# Patient Record
Sex: Female | Born: 1990 | Race: White | Hispanic: No | Marital: Single | Smoking: Current every day smoker
Health system: Southern US, Community
[De-identification: ages and names within clinical notes are randomized; demographics above are authoritative.]

## PROBLEM LIST (undated history)

## (undated) DIAGNOSIS — A6000 Herpesviral infection of urogenital system, unspecified: Secondary | ICD-10-CM

## (undated) DIAGNOSIS — A749 Chlamydial infection, unspecified: Secondary | ICD-10-CM

## (undated) DIAGNOSIS — Z98891 History of uterine scar from previous surgery: Secondary | ICD-10-CM

## (undated) HISTORY — DX: Chlamydial infection, unspecified: A74.9

## (undated) HISTORY — DX: Herpesviral infection of urogenital system, unspecified: A60.00

---
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
## 2013-01-23 ENCOUNTER — Emergency Department: Payer: Self-pay | Admitting: Emergency Medicine

## 2013-01-23 LAB — BASIC METABOLIC PANEL
BUN: 7 mg/dL (ref 7–18)
Chloride: 103 mmol/L (ref 98–107)
Co2: 24 mmol/L (ref 21–32)
Creatinine: 0.62 mg/dL (ref 0.60–1.30)
EGFR (African American): >60
EGFR (Non-African Amer.): >60
Osmolality: 269 (ref 275–301)
Potassium: 3.8 mmol/L (ref 3.5–5.1)
Sodium: 136 mmol/L (ref 136–145)

## 2013-01-23 LAB — URINALYSIS, COMPLETE
Bilirubin,UR: NEGATIVE
Blood: NEGATIVE
Glucose,UR: NEGATIVE mg/dL (ref 0–75)
Nitrite: NEGATIVE
Ph: 5 (ref 4.5–8.0)
Protein: NEGATIVE
Squamous Epithelial: 5
WBC UR: 3 /HPF (ref 0–5)

## 2013-01-23 LAB — CBC
HCT: 38.8 % (ref 35.0–47.0)
HGB: 13.8 g/dL (ref 12.0–16.0)
MCV: 93 fL (ref 80–100)
Platelet: 257 10*3/uL (ref 150–440)
RBC: 4.18 10*6/uL (ref 3.80–5.20)
RDW: 11.9 % (ref 11.5–14.5)

## 2013-01-23 LAB — HCG, QUANTITATIVE, PREGNANCY: Beta Hcg, Quant.: 29944 m[IU]/mL — ABNORMAL HIGH

## 2013-04-10 ENCOUNTER — Emergency Department: Payer: Self-pay | Admitting: Emergency Medicine

## 2013-08-25 ENCOUNTER — Observation Stay: Payer: Self-pay

## 2013-08-27 LAB — BETA STREP CULTURE(ARMC)

## 2013-09-19 ENCOUNTER — Ambulatory Visit: Payer: Self-pay | Admitting: Obstetrics and Gynecology

## 2013-09-19 LAB — CBC WITH DIFFERENTIAL/PLATELET
BASOS ABS: 0 10*3/uL (ref 0.0–0.1)
Basophil %: 0.1 %
Eosinophil #: 0.1 10*3/uL (ref 0.0–0.7)
Eosinophil %: 1.1 %
HCT: 34.1 % — ABNORMAL LOW (ref 35.0–47.0)
HGB: 12 g/dL (ref 12.0–16.0)
LYMPHS ABS: 2.5 10*3/uL (ref 1.0–3.6)
LYMPHS PCT: 19.7 %
MCH: 34.6 pg — ABNORMAL HIGH (ref 26.0–34.0)
MCHC: 35.1 g/dL (ref 32.0–36.0)
MCV: 99 fL (ref 80–100)
Monocyte #: 0.7 x10 3/mm (ref 0.2–0.9)
Monocyte %: 5.7 %
NEUTROS PCT: 73.4 %
Neutrophil #: 9.3 10*3/uL — ABNORMAL HIGH (ref 1.4–6.5)
Platelet: 227 10*3/uL (ref 150–440)
RBC: 3.46 10*6/uL — ABNORMAL LOW (ref 3.80–5.20)
RDW: 13.3 % (ref 11.5–14.5)
WBC: 12.7 10*3/uL — ABNORMAL HIGH (ref 3.6–11.0)

## 2013-09-20 ENCOUNTER — Inpatient Hospital Stay: Payer: Self-pay | Admitting: Obstetrics and Gynecology

## 2013-09-21 LAB — GC/CHLAMYDIA PROBE AMP

## 2013-09-21 LAB — HEMATOCRIT: HCT: 28.9 % — ABNORMAL LOW (ref 35.0–47.0)

## 2013-09-28 ENCOUNTER — Emergency Department: Payer: Self-pay | Admitting: Emergency Medicine

## 2013-09-28 LAB — URINALYSIS, COMPLETE
BACTERIA: NONE SEEN
Bilirubin,UR: NEGATIVE
Glucose,UR: NEGATIVE mg/dL (ref 0–75)
Ketone: NEGATIVE
Nitrite: NEGATIVE
Ph: 6 (ref 4.5–8.0)
Protein: NEGATIVE
Specific Gravity: 1.01 (ref 1.003–1.030)
Squamous Epithelial: 3
WBC UR: 23 /HPF (ref 0–5)

## 2013-09-28 LAB — CBC
HCT: 32.4 % — ABNORMAL LOW (ref 35.0–47.0)
HGB: 10.4 g/dL — AB (ref 12.0–16.0)
MCH: 31.6 pg (ref 26.0–34.0)
MCHC: 32.2 g/dL (ref 32.0–36.0)
MCV: 98 fL (ref 80–100)
Platelet: 379 10*3/uL (ref 150–440)
RBC: 3.29 10*6/uL — ABNORMAL LOW (ref 3.80–5.20)
RDW: 12.8 % (ref 11.5–14.5)
WBC: 11 10*3/uL (ref 3.6–11.0)

## 2013-09-28 LAB — HCG, QUANTITATIVE, PREGNANCY: Beta Hcg, Quant.: 42 m[IU]/mL — ABNORMAL HIGH

## 2013-12-08 ENCOUNTER — Ambulatory Visit: Payer: Self-pay | Admitting: Emergency Medicine

## 2014-03-03 ENCOUNTER — Emergency Department: Payer: Self-pay | Admitting: Emergency Medicine

## 2014-03-03 LAB — BASIC METABOLIC PANEL
Anion Gap: 7 (ref 7–16)
BUN: 11 mg/dL (ref 7–18)
CHLORIDE: 108 mmol/L — AB (ref 98–107)
Calcium, Total: 8.8 mg/dL (ref 8.5–10.1)
Co2: 28 mmol/L (ref 21–32)
Creatinine: 0.7 mg/dL (ref 0.60–1.30)
EGFR (African American): >60
EGFR (Non-African Amer.): >60
Glucose: 105 mg/dL — ABNORMAL HIGH (ref 65–99)
Osmolality: 285 (ref 275–301)
POTASSIUM: 4.1 mmol/L (ref 3.5–5.1)
Sodium: 143 mmol/L (ref 136–145)

## 2014-03-03 LAB — URINALYSIS, COMPLETE
Bilirubin,UR: NEGATIVE
Glucose,UR: NEGATIVE mg/dL (ref 0–75)
Ketone: NEGATIVE
Leukocyte Esterase: NEGATIVE
Nitrite: NEGATIVE
PROTEIN: NEGATIVE
Ph: 7 (ref 4.5–8.0)
Specific Gravity: 1.024 (ref 1.003–1.030)
WBC UR: 2 /HPF (ref 0–5)

## 2014-03-03 LAB — CBC WITH DIFFERENTIAL/PLATELET
BASOS ABS: 0 10*3/uL (ref 0.0–0.1)
Basophil %: 0.5 %
EOS PCT: 4 %
Eosinophil #: 0.3 10*3/uL (ref 0.0–0.7)
HCT: 40.5 % (ref 35.0–47.0)
HGB: 13.6 g/dL (ref 12.0–16.0)
LYMPHS ABS: 2.7 10*3/uL (ref 1.0–3.6)
LYMPHS PCT: 37.6 %
MCH: 31.8 pg (ref 26.0–34.0)
MCHC: 33.6 g/dL (ref 32.0–36.0)
MCV: 95 fL (ref 80–100)
Monocyte #: 0.4 x10 3/mm (ref 0.2–0.9)
Monocyte %: 6.1 %
Neutrophil #: 3.8 10*3/uL (ref 1.4–6.5)
Neutrophil %: 51.8 %
Platelet: 300 10*3/uL (ref 150–440)
RBC: 4.28 10*6/uL (ref 3.80–5.20)
RDW: 13.5 % (ref 11.5–14.5)
WBC: 7.3 10*3/uL (ref 3.6–11.0)

## 2014-03-03 LAB — TROPONIN I: Troponin-I: <0.02 ng/mL

## 2014-08-09 ENCOUNTER — Emergency Department: Payer: Self-pay | Admitting: Emergency Medicine

## 2014-11-04 NOTE — Op Note (Signed)
PATIENT NAME:  Jane Bowman, [name] MR#:  [id number] DATE OF BIRTH:  Feb 13, 1991  DATE OF PROCEDURE:  09/20/2013  PREOPERATIVE DIAGNOSIS: Transverse lie fetus at term.   POSTOPERATIVE DIAGNOSIS: Transverse lie fetus at term.   PROCEDURE: Primary lower uterine segment cesarean section, placement of ON-Q pain pump.   SURGEON: Elliot Gurneyarrie C. Yaneth Fairbairn. M.D.   ASSISTANT: Lebron ConnersAndy Staebler, M.D.   ESTIMATED BLOOD LOSS: 1000 mL.   FINDINGS: Term live-born female infant weighing 8 pounds 9 ounces, Apgars 9 and 9.   DESCRIPTION OF PROCEDURE: The patient was taken to the operating room and placed in supine position. After adequate spinal anesthesia was instilled, the patient was prepped and draped in the usual sterile fashion. A Pfannenstiel skin incision was drawn on the patient's abdomen, and the incision was carried sharply down to the fascia. The fascia was nicked in the midline and was extended in a superolateral manner with a curved Mayo scissors. Kochers were used to separate the fascia from the muscles both superiorly and inferiorly. The muscle belly underlying was identified, split and the peritoneum was grasped and sharply entered. The incision was extended. Bladder blade was placed. Bladder flap was created. Bladder blade was replaced. Uterine incision was made slightly higher due to the transverse back-down lie. Infant's foot delivered. Second foot delivered. Fetus was pulled out of the uterus in a breech position. The right nuchal arm and then the left nuchal arm were delivered and the fetus' head was delivered. The infant's cord was clamped, cut. Infant was handed to the awaiting pediatrician. Cord blood was not obtained because the blood type was A positive. Pitocin was started. The uterus was delivered, and placenta was attempted to be delivered. The placenta was adherent to the uterus and the uterus inverted completely. Terbutaline was given. The placenta was removed, and the uterus was turned right side  out. It was found to be heart shaped with a large septum on the inside which the patient had neglected to tell us. The uterine incision was closed with a running locked chromic suture and then a running imbricating suture. Bladder was tacked back up to the uterine incision with a 3-0 Monocryl. The belly was cleared of clots and irrigated. The uterus was placed back into the abdomen. The gutters were cleared from clots. The muscle bellies were approximated with a running Vicryl suture. ON-Q trocars were placed. Catheters were threaded and wrapped underneath the fascia. The fascia was closed from the angles to the midline with a running Vicryl suture. A TLH was used to approximate the subcutaneous fat. A 4-0 Monocryl was used to approximate the skin edges. Benzoin and Steri-Strips were placed. Tegaderm and 4 x 4's with Dermabond were placed on the trocar port sites. The uterus was massaged. Clots removed. Clear urine was noted in the Foley bag, and the patient was taken to recovery after having tolerated the procedure well.    ____________________________ Elliot Gurneyarrie C. Isador Castille, MD cck:gb D: 09/21/2013 02:45:45 ET T: 09/21/2013 03:04:18 ET JOB#: [id number]  cc: Elliot Gurneyarrie C. Jacarie Pate, MD, <Dictator> Elliot GurneyARRIE C Kamika Goodloe MD ELECTRONICALLY SIGNED 09/23/2013 21:59

## 2014-11-21 NOTE — H&P (Signed)
L&D Evaluation:  History Expanded:  HPI 24 yo G3 P0111, EDD 09/15/13 per LMP and 7 wk US, presents with c/o decreased FM since last night. No ctx, VB or LOF. 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   Blood Type (Maternal) AB positive   Group B Strep Results Maternal (Result >5wks must be treated as unknown) unknown/result > 5 weeks ago  done here today   Maternal HIV Negative   Maternal Syphilis Ab Nonreactive   Maternal Varicella Immune   Rubella Results (Maternal) nonimmune   Exam:  Vital Signs stable   General no apparent distress   Mental Status clear   Chest clear   Abdomen gravid, non-tender   Pelvic no external lesions, 1.5/50/high   Mebranes Intact   FHT normal rate with no decels, category 1tracing, baseline 140, mod variability, + accles, no decels, palpable fetal movement   Ucx absent   Impression:  Impression decreased fetal movement, category 1 fetal tracing   Plan:  Plan discharge   Comments Pt's medicaid had been deactivated, per office it has been reactivated. Pt to f/u at office.  GBS drawn today.  Labor precautions   Follow Up Appointment already scheduled. 08/26/13   Electronic Signatures: Marta AntuBrothers, Dorann Davidson K (CNM)  (Signed 12-Feb-15 13:01)  Authored: L&D Evaluation   Last Updated: 12-Feb-15 13:01 by Vella KohlerBrothers, Monque Haggar K (CNM)

## 2016-01-21 ENCOUNTER — Emergency Department
Admission: EM | Admit: 2016-01-21 | Discharge: 2016-01-21 | Disposition: A | Discharge status: Home / Self Care | Payer: Medicaid Other | Attending: Emergency Medicine | Admitting: Emergency Medicine

## 2016-01-21 ENCOUNTER — Encounter: Payer: Self-pay | Admitting: Emergency Medicine

## 2016-01-21 DIAGNOSIS — F17200 Nicotine dependence, unspecified, uncomplicated: Secondary | ICD-10-CM | POA: Insufficient documentation

## 2016-01-21 DIAGNOSIS — Y999 Unspecified external cause status: Secondary | ICD-10-CM | POA: Insufficient documentation

## 2016-01-21 DIAGNOSIS — Y939 Activity, unspecified: Secondary | ICD-10-CM | POA: Diagnosis not present

## 2016-01-21 DIAGNOSIS — Y929 Unspecified place or not applicable: Secondary | ICD-10-CM | POA: Insufficient documentation

## 2016-01-21 DIAGNOSIS — W109XXA Fall (on) (from) unspecified stairs and steps, initial encounter: Secondary | ICD-10-CM | POA: Diagnosis not present

## 2016-01-21 DIAGNOSIS — S0101XA Laceration without foreign body of scalp, initial encounter: Secondary | ICD-10-CM | POA: Insufficient documentation

## 2016-01-21 HISTORY — DX: History of uterine scar from previous surgery: Z98.891

## 2016-01-21 MED ORDER — BACITRACIN ZINC 500 UNIT/GM EX OINT
1.0000 "application " | TOPICAL_OINTMENT | Freq: Two times a day (BID) | CUTANEOUS | Status: DC
  Filled 2016-01-21: qty 0.9

## 2016-01-21 NOTE — ED Notes (Signed)
Pt presents to ED c/o a fall over steps and hit head on a rock wall; presents with head laceration. Bleeding controlled

## 2016-01-21 NOTE — Discharge Instructions (Signed)
Nonsutured Laceration Care °A laceration is a cut that goes through all layers of the skin and extends into the tissue that is right under the skin. This type of cut is usually stitched up (sutured) or closed with tape (adhesive strips) or skin glue shortly after the injury happens. °However, if the wound is dirty or if several hours pass before medical treatment is provided, it is likely that germs (bacteria) will enter the wound. Closing a laceration after bacteria have entered it increases the risk of infection. In these cases, your health care provider may leave the laceration open (nonsutured) and cover it with a bandage. This type of treatment helps prevent infection and allows the wound to heal from the deepest layer of tissue damage up to the surface. °An open fracture is a type of injury that may involve nonsutured lacerations. An open fracture is a break in a bone that happens along with one or more lacerations through the skin that is near the fracture site. °HOW TO CARE FOR YOUR NONSUTURED LACERATION °· Take or apply over-the-counter and prescription medicines only as told by your health care provider. °· If you were prescribed an antibiotic medicine, take or apply it as told by your health care provider. Do not stop using the antibiotic even if your condition improves. °· Clean the wound one time each day or as told by your health care provider. °¨ Wash the wound with mild soap and water. °¨ Rinse the wound with water to remove all soap. °¨ Pat your wound dry with a clean towel. Do not rub the wound. °· Do not inject anything into the wound unless your health care provider told you to. °· Change any bandages (dressings) as told by your health care provider. This includes changing the dressing if it gets wet, dirty, or starts to smell bad. °· Keep the dressing dry until your health care provider says it can be removed. Do not take baths, swim, or do anything that puts your wound underwater until your  health care provider approves. °· Raise (elevate) the injured area above the level of your heart while you are sitting or lying down, if possible. °· Do not scratch or pick at the wound. °· Check your wound every day for signs of infection. Watch for: °¨ Redness, swelling, or pain. °¨ Fluid, blood, or pus. °· Keep all follow-up visits as told by your health care provider. This is important. °SEEK MEDICAL CARE IF: °· You received a tetanus and shot and you have swelling, severe pain, redness, or bleeding at the injection site.   °· You have a fever. °· Your pain is not controlled with medicine. °· You have increased redness, swelling, or pain at the site of your wound. °· You have fluid, blood, or pus coming from your wound. °· You notice a bad smell coming from your wound or your dressing. °· You notice something coming out of the wound, such as wood or glass. °· You notice a change in the color of your skin near your wound. °· You develop a new rash. °· You need to change the dressing frequently due to fluid, blood, or pus draining from the wound. °· You develop numbness around your wound. °SEEK IMMEDIATE MEDICAL CARE IF: °· Your pain suddenly increases and is severe. °· You develop severe swelling around the wound. °· The wound is on your hand or foot and you cannot properly move a finger or toe. °· The wound is on your hand or   foot and you notice that your fingers or toes look pale or bluish. °· You have a red streak going away from your wound. °  °This information is not intended to replace advice given to you by your health care provider. Make sure you discuss any questions you have with your health care provider. °  °Document Released: 05/28/2006 Document Revised: 11/14/2014 Document Reviewed: 06/26/2014 °Elsevier Interactive Patient Education ©2016 [employer] ° °

## 2016-01-21 NOTE — ED Provider Notes (Signed)
[facility] Emergency Department Provider Note  ____________________________________________  Time seen: Approximately 2:40 PM  I have reviewed the triage vital signs and the nursing notes.   HISTORY  Chief Complaint Head Laceration   HPI Jane Bowman is a 25 y.o. female who presents to the emergency department for evaluation of a scalp laceration that she sustained around 10 PM last night. She trepped over some steps and hit her head on a rock wall. She and some friends poured alcohol on the wound to clean it out. She has since showered. Bleeding continues to be well controlled.   Past Medical History  Diagnosis Date  . H/O: C-section     There are no active problems to display for this patient.   History reviewed. No pertinent past surgical history.  No current outpatient prescriptions on file.  Allergies Amoxicillin  Family History  Problem Relation Age of Onset  . Cancer Mother     Social History Social History  Substance Use Topics  . Smoking status: Current Every Day Smoker  . Smokeless tobacco: None  . Alcohol Use: Yes    Review of Systems  Constitutional: Negative for fever/chills Respiratory: Negative for shortness of breath. Musculoskeletal: Negative for pain. Skin: Positive for laceration. Neurological: Negative for headaches, focal weakness or numbness. ____________________________________________   PHYSICAL EXAM:  VITAL SIGNS: ED Triage Vitals  Enc Vitals Group     BP 01/21/16 1418 127/64 mmHg     Pulse Rate 01/21/16 1418 108     Resp 01/21/16 1418 18     Temp 01/21/16 1418 98.1 F (36.7 C)     Temp Source 01/21/16 1418 Oral     SpO2 01/21/16 1418 95 %     Weight 01/21/16 1418 160 lb (72.576 kg)     Height 01/21/16 1418  (1.575 m)     Head Cir --      Peak Flow --      Pain Score --      Pain Loc --      Pain Edu? --      Excl. in GC? --      Constitutional: Alert and oriented. Well appearing  and in no acute distress. Eyes: Conjunctivae are normal. EOMI. Nose: No congestion/rhinnorhea. Mouth/Throat: Mucous membranes are moist.   Neck: No stridor. Lymphatic: No cervical lymphadenopathy. Cardiovascular: Good peripheral circulation. Respiratory: Normal respiratory effort.  No retractions. Musculoskeletal: FROM throughout. Neurologic:  Normal speech and language. No gross focal neurologic deficits are appreciated. Skin:  2cm partial thickness laceration to the left posterior scalp. No active bleeding.  ____________________________________________   LABS (all labs ordered are listed, but only abnormal results are displayed)  Labs Reviewed - No data to display ____________________________________________  EKG   ____________________________________________  RADIOLOGY  Not indicated. ____________________________________________   PROCEDURES  Procedure(s) performed: None ____________________________________________   INITIAL IMPRESSION / ASSESSMENT AND PLAN / ED COURSE  Pertinent labs & imaging results that were available during my care of the patient were reviewed by me and considered in my medical decision making (see chart for details).  She will be advised to use bacitracin 2 times per day until healed.  She was advised to follow up with her PCP if needed.  She was also advised to return to the emergency department for symptoms that change or worsen if unable to schedule an appointment.  ____________________________________________   FINAL CLINICAL IMPRESSION(S) / ED DIAGNOSES  Final diagnoses:  Scalp laceration, initial encounter    New  Prescriptions   No medications on file     Chinita PesterCari B Kaire Stary, FNP 01/21/16 1446  Richardean Canalavid H Yao, MD 01/21/16 1537

## 2016-07-04 ENCOUNTER — Emergency Department
Admission: EM | Admit: 2016-07-04 | Discharge: 2016-07-04 | Disposition: A | Discharge status: Home / Self Care | Payer: Medicaid Other | Attending: Emergency Medicine | Admitting: Emergency Medicine

## 2016-07-04 DIAGNOSIS — F17200 Nicotine dependence, unspecified, uncomplicated: Secondary | ICD-10-CM | POA: Diagnosis not present

## 2016-07-04 DIAGNOSIS — J069 Acute upper respiratory infection, unspecified: Secondary | ICD-10-CM | POA: Insufficient documentation

## 2016-07-04 DIAGNOSIS — J029 Acute pharyngitis, unspecified: Secondary | ICD-10-CM | POA: Diagnosis present

## 2016-07-04 NOTE — ED Notes (Signed)
Pt presents with sore throat and congestion x 1.5 days. Denies fever, n/v/d, chills. Pt reports cough. NAD noted.

## 2016-07-04 NOTE — ED Notes (Signed)
Reviewed d/c instructions, follow-up care and OTC antipyretics with patient. Pt verbalized understanding

## 2016-07-04 NOTE — ED Triage Notes (Signed)
Sore throat x 2 days. Pt alert and oriented X4, active, cooperative, pt in NAD. RR even and unlabored, color WNL.

## 2016-07-04 NOTE — ED Provider Notes (Signed)
[facility] Emergency Department Provider Note  ____________________________________________  Time seen: Approximately 4:10 PM  I have reviewed the triage vital signs and the nursing notes.   HISTORY  Chief Complaint Sore Throat    HPI Jane Bowman is a 25 y.o. female as to the emergency department with 1 day of a sore throat and nonproductive cough. Patient is eating and drinking normally.  Patient has not taken anything for symptoms. Patient denies fever, sinus pressure, sneezing, shortness of breath, chest pain, nausea, vomiting, abdominal pain. Patient has been with her children whom have similar symptoms.   Past Medical History:  Diagnosis Date  . H/O: C-section     There are no active problems to display for this patient.   History reviewed. No pertinent surgical history.  Prior to Admission medications   Not on File    Allergies Amoxicillin  Family History  Problem Relation Age of Onset  . Cancer Mother     Social History Social History  Substance Use Topics  . Smoking status: Current Every Day Smoker  . Smokeless tobacco: Not on file  . Alcohol use Yes     Review of Systems  Constitutional: No fever/chills Eyes: No discharge. ENT: Negative for congestion and rhinorrhea. Cardiovascular: No chest pain. Respiratory: Positive for cough. No SOB. Gastrointestinal: No abdominal pain.  No nausea, no vomiting.  No diarrhea.  No constipation. Musculoskeletal: Negative for musculoskeletal pain. Skin: Negative for rash, abrasions, lacerations, ecchymosis. Neurological: Negative for headaches.   ____________________________________________   PHYSICAL EXAM:  VITAL SIGNS: ED Triage Vitals [07/04/16 1539]  Enc Vitals Group     BP 118/65     Pulse Rate 91     Resp 16     Temp 98.4 F (36.9 C)     Temp Source Oral     SpO2 100 %     Weight 160 lb (72.6 kg)     Height 5\' 2"  (1.575 m)     Head Circumference      Peak Flow       Pain Score 4     Pain Loc      Pain Edu?      Excl. in GC?      Constitutional: Alert and oriented. Well appearing and in no acute distress. Eyes: Conjunctivae are normal. PERRL. EOMI. No discharge. Head: Atraumatic. ENT: No frontal and maxillary sinus tenderness.      Ears: Tympanic membranes pearly gray with good landmarks. No discharge.      Nose: No congestion/rhinnorhea.      Mouth/Throat: Mucous membranes are moist. Oropharynx non-erythematous. Tonsils not enlarged. No exudates. Uvula midline. Neck: No stridor.   Hematological/Lymphatic/Immunilogical: No cervical lymphadenopathy. Cardiovascular: Normal rate, regular rhythm. Normal S1 and S2.  Good peripheral circulation. Respiratory: Normal respiratory effort without tachypnea or retractions. Lungs CTAB. Good air entry to the bases with no decreased or absent breath sounds. Gastrointestinal: Bowel sounds 4 quadrants. Soft and nontender to palpation. No guarding or rigidity. No palpable masses. No distention. Musculoskeletal: Full range of motion to all extremities. No gross deformities appreciated. Neurologic:  Normal speech and language. No gross focal neurologic deficits are appreciated.  Skin:  Skin is warm, dry and intact. No rash noted. Psychiatric: Mood and affect are normal. Speech and behavior are normal. Patient exhibits appropriate insight and judgement.   ____________________________________________   LABS (all labs ordered are listed, but only abnormal results are displayed)  Labs Reviewed - No data to display ____________________________________________  EKG  ____________________________________________  RADIOLOGY  No results found.  ____________________________________________    PROCEDURES  Procedure(s) performed:    Procedures    Medications - No data to display   ____________________________________________   INITIAL IMPRESSION / ASSESSMENT AND PLAN / ED COURSE  Pertinent  labs & imaging results that were available during my care of the patient were reviewed by me and considered in my medical decision making (see chart for details).  Review of the [place] CSRS was performed in accordance of the [facility] prior to dispensing any controlled drugs.  Clinical Course     Patient's diagnosis is consistent with viral upper respiratory infection. Vital signs and exam are reassuring. Symptoms have been present for 1.5 days. Patient is afebrile in ED and has not taken anything for symptoms. Patient declines wanting any medication for symptoms. Patient is given ED precautions to return to the ED for any worsening or new symptoms.  ____________________________________________  FINAL CLINICAL IMPRESSION(S) / ED DIAGNOSES  Final diagnoses:  Viral upper respiratory tract infection      NEW MEDICATIONS STARTED DURING THIS VISIT:  New Prescriptions   No medications on file        This chart was dictated using voice recognition software/Dragon. Despite best efforts to proofread, errors can occur which can change the meaning. Any change was purely unintentional.    Jane DerryAshley Norleen Xie, PA-C 07/04/16 1711    Jane MoccasinBrian S Quigley, MD 07/04/16 [phone]

## 2016-11-27 ENCOUNTER — Emergency Department
Admission: EM | Admit: 2016-11-27 | Discharge: 2016-11-27 | Disposition: A | Discharge status: Home / Self Care | Payer: Medicaid Other | Attending: Emergency Medicine | Admitting: Emergency Medicine

## 2016-11-27 ENCOUNTER — Emergency Department: Payer: Medicaid Other

## 2016-11-27 ENCOUNTER — Encounter: Payer: Self-pay | Admitting: Emergency Medicine

## 2016-11-27 DIAGNOSIS — R0789 Other chest pain: Secondary | ICD-10-CM | POA: Diagnosis not present

## 2016-11-27 DIAGNOSIS — F17200 Nicotine dependence, unspecified, uncomplicated: Secondary | ICD-10-CM | POA: Diagnosis not present

## 2016-11-27 DIAGNOSIS — R0602 Shortness of breath: Secondary | ICD-10-CM | POA: Insufficient documentation

## 2016-11-27 DIAGNOSIS — R079 Chest pain, unspecified: Secondary | ICD-10-CM | POA: Diagnosis present

## 2016-11-27 LAB — CBC
HCT: 37 % (ref 35.0–47.0)
Hemoglobin: 13.1 g/dL (ref 12.0–16.0)
MCH: 33 pg (ref 26.0–34.0)
MCHC: 35.4 g/dL (ref 32.0–36.0)
MCV: 93 fL (ref 80.0–100.0)
PLATELETS: 266 10*3/uL (ref 150–440)
RBC: 3.98 MIL/uL (ref 3.80–5.20)
RDW: 11.8 % (ref 11.5–14.5)
WBC: 4.8 10*3/uL (ref 3.6–11.0)

## 2016-11-27 LAB — POCT PREGNANCY, URINE: PREG TEST UR: NEGATIVE

## 2016-11-27 LAB — BASIC METABOLIC PANEL
ANION GAP: 5 (ref 5–15)
BUN: 12 mg/dL (ref 6–20)
CALCIUM: 8.9 mg/dL (ref 8.9–10.3)
CO2: 26 mmol/L (ref 22–32)
CREATININE: 0.68 mg/dL (ref 0.44–1.00)
Chloride: 106 mmol/L (ref 101–111)
GFR calc non Af Amer: >60 mL/min (ref >60)
Glucose, Bld: 85 mg/dL (ref 65–99)
Potassium: 3.8 mmol/L (ref 3.5–5.1)
Sodium: 137 mmol/L (ref 135–145)

## 2016-11-27 LAB — TROPONIN I

## 2016-11-27 LAB — FIBRIN DERIVATIVES D-DIMER (ARMC ONLY): FIBRIN DERIVATIVES D-DIMER (ARMC): 209.84 (ref 0.00–499.00)

## 2016-11-27 NOTE — ED Triage Notes (Signed)
Pt reports left side non radiating chest pain with SOB on exertion for two days.

## 2016-11-27 NOTE — ED Provider Notes (Signed)
[facility] Emergency Department Provider Note  ____________________________________________  Time seen: Approximately 10:06 AM  I have reviewed the triage vital signs and the nursing notes.   HISTORY  Chief Complaint Chest Pain    HPI Jane Bowman is a 26 y.o. female who complains of chest pain since yesterday. Gradual onset, constant, nonradiating. Feels sharp. Worse with breathing as well as lifting her daughter or twisting her thorax to reach behind her for her seatbelt. Denies diaphoresis or vomiting. Mild shortness of breath is present. No alleviating factors.  She reports having a similar pain in the right side of her chest in Jane 2017. This lasted for approximately 6 months before resolving. She reports seeing her doctors but never arriving at a diagnosis.     Past Medical History:  Diagnosis Date  . H/O: C-section      There are no active problems to display for this patient.    No past surgical history on file. C-section  Prior to Admission medications   Medication Sig Start Date End Date Taking? Authorizing Provider  cetirizine (ZYRTEC) 10 MG tablet Take 10 mg by mouth daily. 10/29/16  Yes [provider]  Oral birth control pills   Allergies Amoxicillin   Family History  Problem Relation Age of Onset  . Cancer Mother     Social History Social History  Substance Use Topics  . Smoking status: Current Every Day Smoker  . Smokeless tobacco: Not on file  . Alcohol use Yes    Review of Systems  Constitutional:   No fever or chills.  ENT:   No sore throat. No rhinorrhea. Cardiovascular:   Positive chest pain as above.Marland Kitchen. Respiratory:   Mild shortness of breath without cough. Gastrointestinal:   Negative for abdominal pain, vomiting and diarrhea.  Musculoskeletal:   Negative for focal pain or swelling All other systems reviewed and are negative except as documented above in ROS and  HPI.  ____________________________________________   PHYSICAL EXAM:  VITAL SIGNS: ED Triage Vitals  Enc Vitals Group     BP 11/27/16 0935 (!) 115/52     Pulse Rate 11/27/16 0935 70     Resp 11/27/16 0935 18     Temp 11/27/16 0935 98.2 F (36.8 C)     Temp Source 11/27/16 0935 Oral     SpO2 11/27/16 0935 100 %     Weight 11/27/16 0936 160 lb (72.6 kg)     Height 11/27/16 0936 5\' 2"  (1.575 m)     Head Circumference --      Peak Flow --      Pain Score 11/27/16 0936 9     Pain Loc --      Pain Edu? --      Excl. in GC? --     Vital signs reviewed, nursing assessments reviewed.   Constitutional:   Alert and oriented. Well appearing and in no distress. Eyes:   No scleral icterus. No conjunctival pallor. PERRL. EOMI.  No nystagmus. ENT   Head:   Normocephalic and atraumatic.   Nose:   No congestion/rhinnorhea.    Mouth/Throat:   MMM, no pharyngeal erythema. No peritonsillar mass.    Neck:   No meningismus. Full ROM. No crepitus Hematological/Lymphatic/Immunilogical:   No cervical lymphadenopathy. Cardiovascular:   RRR. Symmetric bilateral radial and DP pulses.  No murmurs.  Respiratory:   Normal respiratory effort without tachypnea/retractions. Breath sounds are clear and equal bilaterally. No wheezes/rales/rhonchi. Gastrointestinal:   Soft and nontender. Non distended.  There is no CVA tenderness.  No rebound, rigidity, or guarding. Genitourinary:   deferred Musculoskeletal:   Normal range of motion in all extremities. No joint effusions.  No lower extremity tenderness.  No edema. Neurologic:   Normal speech and language.   Motor grossly intact. No gross focal neurologic deficits are appreciated.    ____________________________________________    LABS (pertinent positives/negatives) (all labs ordered are listed, but only abnormal results are displayed) Labs Reviewed  BASIC METABOLIC PANEL  CBC  TROPONIN I  FIBRIN DERIVATIVES D-DIMER ([facility] ONLY)  POC  URINE PREG, ED  POCT PREGNANCY, URINE   ____________________________________________   EKG  Interpreted by me  Date: 11/27/2016  Rate: 65  Rhythm: normal sinus rhythm  QRS Axis: normal  Intervals: normal  ST/T Wave abnormalities: normal  Conduction Disutrbances: none  Narrative Interpretation: unremarkable      ____________________________________________    RADIOLOGY  Dg Chest 2 View  Result Date: 11/27/2016 CLINICAL DATA:  Left-sided chest pain and shortness of Breath, history of tobacco use, initial encounter EXAM: CHEST  2 VIEW COMPARISON:  None. FINDINGS: The heart size and mediastinal contours are within normal limits. Both lungs are clear. The visualized skeletal structures are unremarkable. IMPRESSION: No active cardiopulmonary disease. Electronically Signed   By: Alcide Clever M.D.   On: 11/27/2016 10:23    ____________________________________________   PROCEDURES Procedures  ____________________________________________   INITIAL IMPRESSION / ASSESSMENT AND PLAN / ED COURSE  Pertinent labs & imaging results that were available during my care of the patient were reviewed by me and considered in my medical decision making (see chart for details). Patient complains of chest pain which is likely musculoskeletal chest wall pain with exam findings and nature of aggravating factors. However with her birth control use, smoking, and other unusual factors will get a d-dimer to screen for venous thromboembolism. Have low suspicion for PE given her normal vital signs. No evidence of DVT on exam.   ----------------------------------------- 12:05 PM on 11/27/2016 -----------------------------------------  D-dimer within normal limits. Workup unremarkable, vitals normal. We'll discharge home for follow-up with primary care. Low suspicion of ACS dissection pericarditis or pneumonia     ____________________________________________   FINAL CLINICAL IMPRESSION(S) / ED  DIAGNOSES  Final diagnoses:  Chest wall pain      New Prescriptions   No medications on file     Portions of this note were generated with dragon dictation software. Dictation errors may occur despite best attempts at proofreading.    Sharman Cheek, MD 11/27/16 [phone]

## 2016-11-27 NOTE — Discharge Instructions (Signed)
Your EKG, chest xray, and lab tests today were all unremarkable. Follow up with your doctor for continued monitoring of your symptoms.

## 2017-02-13 ENCOUNTER — Other Ambulatory Visit: Payer: Self-pay | Admitting: Obstetrics and Gynecology

## 2017-03-14 ENCOUNTER — Other Ambulatory Visit: Payer: Self-pay | Admitting: Obstetrics and Gynecology

## 2017-03-17 ENCOUNTER — Telehealth: Payer: Self-pay

## 2017-03-17 NOTE — Telephone Encounter (Signed)
Pt called after hour nurse for refill of Aviane.  JEG was contacted and sent in refill.  Pt has appt sched for annual 9/26.

## 2017-04-08 ENCOUNTER — Ambulatory Visit (INDEPENDENT_AMBULATORY_CARE_PROVIDER_SITE_OTHER): Payer: Medicaid Other | Admitting: Obstetrics and Gynecology

## 2017-04-08 ENCOUNTER — Encounter: Payer: Self-pay | Admitting: Obstetrics and Gynecology

## 2017-04-08 VITALS — BP 110/70 | HR 72 | Ht 62.0 in | Wt 175.0 lb

## 2017-04-08 DIAGNOSIS — Z0000 Encounter for general adult medical examination without abnormal findings: Secondary | ICD-10-CM

## 2017-04-08 DIAGNOSIS — Z3041 Encounter for surveillance of contraceptive pills: Secondary | ICD-10-CM

## 2017-04-08 DIAGNOSIS — Z113 Encounter for screening for infections with a predominantly sexual mode of transmission: Secondary | ICD-10-CM

## 2017-04-08 DIAGNOSIS — Z01419 Encounter for gynecological examination (general) (routine) without abnormal findings: Secondary | ICD-10-CM | POA: Diagnosis not present

## 2017-04-08 DIAGNOSIS — Z124 Encounter for screening for malignant neoplasm of cervix: Secondary | ICD-10-CM

## 2017-04-08 MED ORDER — NORETHINDRONE ACET-ETHINYL EST 1-20 MG-MCG PO TABS: 1.0000 | ORAL_TABLET | Freq: Every day | ORAL | 12 refills | Status: DC

## 2017-04-08 NOTE — Progress Notes (Signed)
PCP:  [facility]   Chief Complaint  Patient presents with  . Gynecologic Exam    STD SCR; BC     HPI:      [name] is a 26 y.o. No obstetric history on file. who LMP was Patient's last menstrual period was 04/08/2017., presents today for her annual examination.  Her menses are regular every 28-30 days (but coming mid 3rd wk to 4th wk of OCPs), lasting 5 days.  Dysmenorrhea mild, occurring first 1-2 days of flow. She does not have intermenstrual bleeding. She has also been getting random migrainous headaches, without aura. She doesn't remember having headaches off OCPs.  Sex activity: single partner, contraception - OCP (estrogen/progesterone).  Last Pap: December 26, 2015  Results were: no abnormalities  Hx of STDs: chlamydia  There is a FH of breast cancer in her mat grt aunt, genetic testing not indicated. There is no FH of ovarian cancer. The patient does not do self-breast exams.  Tobacco use: The patient currently smokes 1/2 packs of cigarettes per day for the past many years.  Trying to cut down. Alcohol use: none No drug use.  Exercise: not active  She does not get adequate calcium and Vitamin D in her diet.   Past Medical History:  Diagnosis Date  . H/O: C-section     Past Surgical History:  Procedure Laterality Date  . CESAREAN SECTION  2015   CCW  . INDUCED ABORTION  2013    Family History  Problem Relation Age of Onset  . Hypertension Mother   . Cancer Maternal Uncle 28       BRAIN STAGE 4  . Hypertension Paternal Uncle   . Hypertension Maternal Grandmother   . Hypertension Paternal Grandfather   . Breast cancer Other     Social History   Social History  . Marital status: Single    Spouse name: N/A  . Number of children: N/A  . Years of education: N/A   Occupational History  . Not on file.   Social History Main Topics  . Smoking status: Current Every Day Smoker  . Smokeless tobacco: Never Used  .  Alcohol use Yes  . Drug use: No  . Sexual activity: Yes    Birth control/ protection: Pill   Other Topics Concern  . Not on file   Social History Narrative  . No narrative on file    Current Meds  Medication Sig  . [DISCONTINUED] AVIANE 0.1-20 MG-MCG tablet TAKE 1 TABLET BY MOUTH EVERY DAY     ROS:  Review of Systems  Constitutional: Positive for fatigue. Negative for fever and unexpected weight change.  Respiratory: Negative for cough, shortness of breath and wheezing.   Cardiovascular: Negative for chest pain, palpitations and leg swelling.  Gastrointestinal: Negative for blood in stool, constipation, diarrhea, nausea and vomiting.  Endocrine: Negative for cold intolerance, heat intolerance and polyuria.  Genitourinary: Negative for dyspareunia, dysuria, flank pain, frequency, genital sores, hematuria, menstrual problem, pelvic pain, urgency, vaginal bleeding, vaginal discharge and vaginal pain.  Musculoskeletal: Negative for back pain, joint swelling and myalgias.  Skin: Negative for rash.  Neurological: Positive for headaches. Negative for dizziness, syncope, light-headedness and numbness.  Hematological: Negative for adenopathy.  Psychiatric/Behavioral: Negative for agitation, confusion, sleep disturbance and suicidal ideas. The patient is not nervous/anxious.      Objective: BP 110/70   Pulse 72   Ht  (1.575 m)   Wt 175 lb (79.4  kg)   LMP 04/08/2017   BMI 32.01 kg/m    Physical Exam  Constitutional: She is oriented to person, place, and time. She appears well-developed and well-nourished.  Genitourinary: Vagina normal and uterus normal. There is no rash or tenderness on the right labia. There is no rash or tenderness on the left labia. No erythema or tenderness in the vagina. No vaginal discharge found. Right adnexum does not display mass and does not display tenderness. Left adnexum does not display mass and does not display tenderness. Cervix does not  exhibit motion tenderness or polyp. Uterus is not enlarged or tender.  Neck: Normal range of motion. No thyromegaly present.  Cardiovascular: Normal rate, regular rhythm and normal heart sounds.   No murmur heard. Pulmonary/Chest: Effort normal and breath sounds normal. Right breast exhibits no mass, no nipple discharge, no skin change and no tenderness. Left breast exhibits no mass, no nipple discharge, no skin change and no tenderness.  Abdominal: Soft. There is no tenderness. There is no guarding.  Musculoskeletal: Normal range of motion.  Neurological: She is alert and oriented to person, place, and time. No cranial nerve deficit.  Psychiatric: She has a normal mood and affect. Her behavior is normal.  Vitals reviewed.   Assessment/Plan: Encounter for annual routine gynecological examination  Cervical cancer screening - Plan: IGP,CtNgTv,rfx Aptima HPV ASCU  Screening for STD (sexually transmitted disease) - Plan: IGP,CtNgTv,rfx Aptima HPV ASCU  Encounter for surveillance of contraceptive pills - OCP change for better cycle control/HA. Rx loestrin 24. F/u prn. Also discussed depo, nexplanon, IUD. Pt not interested in them.  - Plan: norethindrone-ethinyl estradiol (MICROGESTIN) 1-20 MG-MCG tablet  Meds ordered this encounter  Medications  . norethindrone-ethinyl estradiol (MICROGESTIN) 1-20 MG-MCG tablet    Sig: Take 1 tablet by mouth daily.    Dispense:  28 tablet    Refill:  12             GYN counsel family planning choices, adequate intake of calcium and vitamin D, diet and exercise     F/U  Return in about 1 year (around 04/08/2018).  Alicia B. Copland, PA-C 04/08/2017 2:45 PM

## 2017-04-10 LAB — IGP,CTNGTV,RFX APTIMA HPV ASCU
CHLAMYDIA, NUC. ACID AMP: NEGATIVE
Gonococcus, Nuc. Acid Amp: NEGATIVE
PAP Smear Comment: 0
Trich vag by NAA: NEGATIVE

## 2017-05-18 ENCOUNTER — Telehealth: Payer: Self-pay

## 2017-05-18 NOTE — Telephone Encounter (Signed)
Pt has question about her bc.  [phone].  LMTC.

## 2017-05-22 NOTE — Telephone Encounter (Signed)
Left second msg to call me.

## 2017-05-25 NOTE — Telephone Encounter (Signed)
Pt called after hour nurse 05/23/17 stating she was switched from 4 to 3 wk bcp.  Wasn't told to stop for a week so took pills straight through month and did not have a period.  Receiving diff brand each time she goes to get rx filled. No other sxs x h/a.  After hour nurse adv to start new pack after she has been off pills for 7d even if she is still having some bleeding.

## 2017-07-13 ENCOUNTER — Telehealth: Payer: Self-pay

## 2017-07-13 ENCOUNTER — Other Ambulatory Visit: Payer: Self-pay | Admitting: Obstetrics and Gynecology

## 2017-07-13 NOTE — Telephone Encounter (Signed)
Rx eRxd. RN to notify pt

## 2017-07-13 NOTE — Telephone Encounter (Signed)
Pt is requesting refill on valtrex to send to [facility] [place]. Please advise. Pt cb# [phone]

## 2017-07-13 NOTE — Telephone Encounter (Signed)
Pt aware.

## 2017-07-13 NOTE — Telephone Encounter (Signed)
Jane Bowman did annual exam in September. Task forwarded to her

## 2017-09-12 ENCOUNTER — Other Ambulatory Visit: Payer: Self-pay

## 2017-09-12 ENCOUNTER — Emergency Department
Admission: EM | Admit: 2017-09-12 | Discharge: 2017-09-12 | Disposition: A | Discharge status: Home / Self Care | Payer: Medicaid Other | Attending: Emergency Medicine | Admitting: Emergency Medicine

## 2017-09-12 DIAGNOSIS — F17210 Nicotine dependence, cigarettes, uncomplicated: Secondary | ICD-10-CM | POA: Diagnosis not present

## 2017-09-12 DIAGNOSIS — Z79899 Other long term (current) drug therapy: Secondary | ICD-10-CM | POA: Insufficient documentation

## 2017-09-12 DIAGNOSIS — N760 Acute vaginitis: Secondary | ICD-10-CM | POA: Diagnosis not present

## 2017-09-12 DIAGNOSIS — B9689 Other specified bacterial agents as the cause of diseases classified elsewhere: Secondary | ICD-10-CM

## 2017-09-12 DIAGNOSIS — R1031 Right lower quadrant pain: Secondary | ICD-10-CM | POA: Diagnosis present

## 2017-09-12 LAB — URINALYSIS, COMPLETE (UACMP) WITH MICROSCOPIC
BILIRUBIN URINE: NEGATIVE
Bacteria, UA: NONE SEEN
GLUCOSE, UA: NEGATIVE mg/dL
Ketones, ur: 5 mg/dL — AB
LEUKOCYTES UA: NEGATIVE
NITRITE: NEGATIVE
PH: 5 (ref 5.0–8.0)
Protein, ur: NEGATIVE mg/dL
SPECIFIC GRAVITY, URINE: 1.016 (ref 1.005–1.030)
WBC, UA: NONE SEEN WBC/hpf (ref 0–5)

## 2017-09-12 LAB — BASIC METABOLIC PANEL
Anion gap: 8 (ref 5–15)
BUN: 10 mg/dL (ref 6–20)
CO2: 22 mmol/L (ref 22–32)
CREATININE: 0.71 mg/dL (ref 0.44–1.00)
Calcium: 8.9 mg/dL (ref 8.9–10.3)
Chloride: 107 mmol/L (ref 101–111)
GFR calc non Af Amer: >60 mL/min (ref >60)
GLUCOSE: 91 mg/dL (ref 65–99)
Potassium: 4 mmol/L (ref 3.5–5.1)
Sodium: 137 mmol/L (ref 135–145)

## 2017-09-12 LAB — POCT PREGNANCY, URINE: Preg Test, Ur: NEGATIVE

## 2017-09-12 LAB — CBC
HCT: 38.8 % (ref 35.0–47.0)
Hemoglobin: 13.2 g/dL (ref 12.0–16.0)
MCH: 32.8 pg (ref 26.0–34.0)
MCHC: 34.1 g/dL (ref 32.0–36.0)
MCV: 96.1 fL (ref 80.0–100.0)
PLATELETS: 262 10*3/uL (ref 150–440)
RBC: 4.03 MIL/uL (ref 3.80–5.20)
RDW: 12.7 % (ref 11.5–14.5)
WBC: 7.4 10*3/uL (ref 3.6–11.0)

## 2017-09-12 LAB — CHLAMYDIA/NGC RT PCR (ARMC ONLY)
Chlamydia Tr: NOT DETECTED
N GONORRHOEAE: NOT DETECTED

## 2017-09-12 LAB — WET PREP, GENITAL
Sperm: NONE SEEN
Trich, Wet Prep: NONE SEEN
YEAST WET PREP: NONE SEEN

## 2017-09-12 MED ORDER — ONDANSETRON 4 MG PO TBDP: 4.0000 mg | ORAL_TABLET | Freq: Three times a day (TID) | ORAL | 0 refills | Status: DC | PRN

## 2017-09-12 MED ORDER — METRONIDAZOLE 500 MG PO TABS: 500.0000 mg | ORAL_TABLET | Freq: Two times a day (BID) | ORAL | 0 refills | Status: DC

## 2017-09-12 NOTE — ED Triage Notes (Signed)
Pt states since yest R side lower back and side pain. Denies urinary symptoms. Denies hx of kidney stone. Alert, oriented, ambulatory. Denies N&V.

## 2017-09-12 NOTE — Discharge Instructions (Signed)
Your tests today reveal Bacterial Vaginosis, which can be treated with metronidazole.  This antibiotic can cause upset stomach, so take Zofran as needed.  Return to the ER if you develop fever, vomiting, or worsening pain in the right lower side of your abdomen.

## 2017-09-12 NOTE — ED Notes (Signed)
Patient ambulatory to lobby with steady gait and NAD noted. Verbalized understanding of discharge instructions, prescriptions and follow-up care.   

## 2017-09-12 NOTE — ED Provider Notes (Signed)
[facility] Emergency Department Provider Note  ____________________________________________  Time seen: Approximately 5:07 PM  I have reviewed the triage vital signs and the nursing notes.   HISTORY  Chief Complaint Flank Pain    HPI Jane Bowman is a 27 y.o. female who complains of right lower abdominal and side pain since yesterday. Constant, waxing and waning. Worse with certain positions, better with certain other positions. No dysuria frequency urgency. No vaginal bleeding or discharge or vaginal pain. Denies any fevers chills sweats or vomiting. Normal oral intake. Symptoms are mild to moderate intensity and aching. She does report a history of chlamydia and has some suspicion that she has a sexual transmitted infection again.     Past Medical History:  Diagnosis Date  . H/O: C-section      There are no active problems to display for this patient.    Past Surgical History:  Procedure Laterality Date  . CESAREAN SECTION  2015   CCW  . INDUCED ABORTION  2013     Prior to Admission medications   Medication Sig Start Date End Date Taking? Authorizing Provider  metroNIDAZOLE (FLAGYL) 500 MG tablet Take 1 tablet (500 mg total) by mouth 2 (two) times daily. 09/12/17   Sharman Cheek, MD  norethindrone-ethinyl estradiol (MICROGESTIN) 1-20 MG-MCG tablet Take 1 tablet by mouth daily. 04/08/17   Copland, Helmut Muster B, PA-C  ondansetron (ZOFRAN ODT) 4 MG disintegrating tablet Take 1 tablet (4 mg total) by mouth every 8 (eight) hours as needed for nausea or vomiting. 09/12/17   Sharman Cheek, MD  valACYclovir (VALTREX) 500 MG tablet TAKE 1 TABLET BY MOUTH TWICE DAILY FOR 3 DAYS 07/13/17   Copland, Ilona Sorrel, PA-C     Allergies Amoxicillin   Family History  Problem Relation Age of Onset  . Hypertension Mother   . Cancer Maternal Uncle 28       BRAIN STAGE 4  . Hypertension Paternal Uncle   . Hypertension Maternal Grandmother   .  Hypertension Paternal Grandfather   . Breast cancer Other     Social History Social History   Tobacco Use  . Smoking status: Current Every Day Smoker  . Smokeless tobacco: Never Used  Substance Use Topics  . Alcohol use: Yes  . Drug use: No    Review of Systems  Constitutional:   No fever or chills.  ENT:   No sore throat. No rhinorrhea. Cardiovascular:   No chest pain or syncope. Respiratory:   No dyspnea or cough. Gastrointestinal:   positive as above for abdominal pain without vomiting and diarrhea.  Musculoskeletal:   Negative for focal pain or swelling All other systems reviewed and are negative except as documented above in ROS and HPI.  ____________________________________________   PHYSICAL EXAM:  VITAL SIGNS: ED Triage Vitals  Enc Vitals Group     BP 09/12/17 1336 117/72     Pulse Rate 09/12/17 1336 68     Resp 09/12/17 1336 16     Temp 09/12/17 1336 98.1 F (36.7 C)     Temp Source 09/12/17 1336 Oral     SpO2 09/12/17 1336 98 %     Weight 09/12/17 1337 160 lb (72.6 kg)     Height 09/12/17 1337 5\' 2"  (1.575 m)     Head Circumference --      Peak Flow --      Pain Score 09/12/17 1341 6     Pain Loc --      Pain Edu? --  Excl. in GC? --     Vital signs reviewed, nursing assessments reviewed.   Constitutional:   Alert and oriented. Well appearing and in no distress. Eyes:   No scleral icterus.  EOMI.  ENT   Head:   Normocephalic and atraumatic.   Nose:   No congestion/rhinnorhea.    Mouth/Throat:   MMM, no pharyngeal erythema. No peritonsillar mass.    Neck:   No meningismus. Full ROM. Hematological/Lymphatic/Immunilogical:   No cervical lymphadenopathy. Cardiovascular:   RRR. Symmetric bilateral radial and DP pulses.  No murmurs.  Respiratory:   Normal respiratory effort without tachypnea/retractions. Breath sounds are clear and equal bilaterally. No wheezes/rales/rhonchi. Gastrointestinal:   Soft and nontender. Non distended.  There is no CVA tenderness.  No rebound, rigidity, or guarding.no tenderness at McBurney's point Genitourinary:   performed with nurse Vernona RiegerLaura at bedside. External exam unremarkable. No significant discharge in the vault. No CMT. No adnexal tenderness or masses. Bimanual exam unremarkable Musculoskeletal:   Normal range of motion in all extremities. No joint effusions.  No lower extremity tenderness.  No edema. Neurologic:   Normal speech and language.  Motor grossly intact. No acute focal neurologic deficits are appreciated.  Skin:    Skin is warm, dry and intact. No rash noted.  No petechiae, purpura, or bullae.  ____________________________________________    LABS (pertinent positives/negatives) (all labs ordered are listed, but only abnormal results are displayed) Labs Reviewed  WET PREP, GENITAL - Abnormal; Notable for the following components:      Result Value   Clue Cells Wet Prep HPF POC PRESENT (*)    WBC, Wet Prep HPF POC MANY (*)    All other components within normal limits  URINALYSIS, COMPLETE (UACMP) WITH MICROSCOPIC - Abnormal; Notable for the following components:   Color, Urine YELLOW (*)    APPearance CLEAR (*)    Hgb urine dipstick MODERATE (*)    Ketones, ur 5 (*)    Squamous Epithelial / LPF 0-5 (*)    All other components within normal limits  CHLAMYDIA/NGC RT PCR ([facility] ONLY)  BASIC METABOLIC PANEL  CBC  POC URINE PREG, ED  POCT PREGNANCY, URINE   ____________________________________________   EKG    ____________________________________________    RADIOLOGY  No results found.  ____________________________________________   PROCEDURES Procedures  ____________________________________________    CLINICAL IMPRESSION / ASSESSMENT AND PLAN / ED COURSE  Pertinent labs & imaging results that were available during my care of the patient were reviewed by me and considered in my medical decision making (see chart for details).   .  Well appearing no acute distress, presents with right lower quadrant abdominal pain. Vital signs are normal. Patient is very calm and comfortable appearing. Exam is benign and reassuring. Due to patient's reported suspicion that she could have an STI, a pelvic exam was performed which does show bacterial vaginosis on the wet prep. Engaged and shared decision making with the patient regarding presumptive treatment of chlamydia or gonorrhea, she declines at this time. She agrees to a call back with this test result is available to determine if she needs any additional antibiotics. I'll prescribe her Flagyl and Zofran. Follow up with primary care. Counseled her on possible early appendicitis and return if she has worsening symptoms.Considering the patient's symptoms, medical history, and physical examination today, I have low suspicion for cholecystitis or biliary pathology, pancreatitis, perforation or bowel obstruction, hernia, intra-abdominal abscess, AAA or dissection, volvulus or intussusception, mesenteric ischemia, or appendicitis.I doubt PID TOA or  torsion. Not pregnant.        ____________________________________________   FINAL CLINICAL IMPRESSION(S) / ED DIAGNOSES    Final diagnoses:  RLQ abdominal pain  Bacterial vaginosis     ED Discharge Orders        Ordered    metroNIDAZOLE (FLAGYL) 500 MG tablet  2 times daily     09/12/17 1704    ondansetron (ZOFRAN ODT) 4 MG disintegrating tablet  Every 8 hours PRN     09/12/17 1704      Portions of this note were generated with dragon dictation software. Dictation errors may occur despite best attempts at proofreading.    Sharman Cheek, MD 09/12/17 [phone]

## 2018-04-03 ENCOUNTER — Other Ambulatory Visit: Payer: Self-pay | Admitting: Obstetrics and Gynecology

## 2018-04-03 DIAGNOSIS — Z3041 Encounter for surveillance of contraceptive pills: Secondary | ICD-10-CM

## 2018-05-02 ENCOUNTER — Other Ambulatory Visit: Payer: Self-pay | Admitting: Certified Nurse Midwife

## 2018-05-02 DIAGNOSIS — Z3041 Encounter for surveillance of contraceptive pills: Secondary | ICD-10-CM

## 2018-05-02 MED ORDER — NORETHINDRONE ACET-ETHINYL EST 1-20 MG-MCG PO TABS: 1.0000 | ORAL_TABLET | Freq: Every day | ORAL | 0 refills | Status: DC

## 2018-06-16 ENCOUNTER — Emergency Department
Admission: EM | Admit: 2018-06-16 | Discharge: 2018-06-17 | Disposition: A | Discharge status: Home / Self Care | Payer: Medicaid Other | Attending: Emergency Medicine | Admitting: Emergency Medicine

## 2018-06-16 ENCOUNTER — Other Ambulatory Visit: Payer: Self-pay

## 2018-06-16 ENCOUNTER — Encounter: Payer: Self-pay | Admitting: *Deleted

## 2018-06-16 DIAGNOSIS — Z79899 Other long term (current) drug therapy: Secondary | ICD-10-CM | POA: Insufficient documentation

## 2018-06-16 DIAGNOSIS — N760 Acute vaginitis: Secondary | ICD-10-CM | POA: Insufficient documentation

## 2018-06-16 DIAGNOSIS — F17200 Nicotine dependence, unspecified, uncomplicated: Secondary | ICD-10-CM | POA: Insufficient documentation

## 2018-06-16 DIAGNOSIS — B9689 Other specified bacterial agents as the cause of diseases classified elsewhere: Secondary | ICD-10-CM

## 2018-06-16 LAB — URINALYSIS, COMPLETE (UACMP) WITH MICROSCOPIC
Bilirubin Urine: NEGATIVE
Glucose, UA: NEGATIVE mg/dL
Hgb urine dipstick: NEGATIVE
Ketones, ur: NEGATIVE mg/dL
NITRITE: NEGATIVE
Protein, ur: NEGATIVE mg/dL
Specific Gravity, Urine: 1.016 (ref 1.005–1.030)
pH: 8 (ref 5.0–8.0)

## 2018-06-16 LAB — POCT PREGNANCY, URINE: Preg Test, Ur: NEGATIVE

## 2018-06-16 LAB — WET PREP, GENITAL
Sperm: NONE SEEN
Trich, Wet Prep: NONE SEEN
Yeast Wet Prep HPF POC: NONE SEEN

## 2018-06-16 LAB — CHLAMYDIA/NGC RT PCR (ARMC ONLY)
CHLAMYDIA TR: NOT DETECTED
N gonorrhoeae: NOT DETECTED

## 2018-06-16 MED ORDER — METRONIDAZOLE 500 MG PO TABS
500.0000 mg | ORAL_TABLET | Freq: Once | ORAL | Status: AC
  Administered 2018-06-16: 500 mg via ORAL
  Filled 2018-06-16: qty 1

## 2018-06-16 MED ORDER — METRONIDAZOLE 500 MG PO TABS: 500.0000 mg | ORAL_TABLET | Freq: Two times a day (BID) | ORAL | 0 refills | Status: DC

## 2018-06-16 MED ORDER — FLUCONAZOLE 150 MG PO TABS: 150.0000 mg | ORAL_TABLET | Freq: Once | ORAL | 0 refills | Status: AC

## 2018-06-16 NOTE — ED Notes (Signed)
Pt stated that she has had white vaginal discharge for the past few days along with itching. Pt stated that she was having burning with urination but none at present time. Denies any abd pain. Pt has 1 sexual partner.

## 2018-06-16 NOTE — ED Triage Notes (Signed)
Pt has vaginal discharge and itching for 1 week.  No dysuria.  No vag bleeding . Pt alert

## 2018-06-16 NOTE — ED Provider Notes (Signed)
[facility] Emergency Department Provider Note  ____________________________________________  Time seen: Approximately 7:41 PM  I have reviewed the triage vital signs and the nursing notes.   HISTORY  Chief Complaint Vaginal Discharge    HPI Jane Bowman is a 27 y.o. female who presents the emergency department complaining of vaginal itching, discharge for several days.  Patient reports that she had one episode of dysuria but states that this has resolved.  Patient denies any polyuria, hematuria.  No vaginal bleeding.  Patient denies chance of pregnancy.  She has 1 sexual partner but states that she would like to be tested for STDs.  Patient denies any fevers or chills, abdominal pain.  No medication for his complaint prior to arrival.    Past Medical History:  Diagnosis Date  . H/O: C-section     There are no active problems to display for this patient.   Past Surgical History:  Procedure Laterality Date  . CESAREAN SECTION  2015   CCW  . INDUCED ABORTION  2013    Prior to Admission medications   Medication Sig Start Date End Date Taking? Authorizing Provider  fluconazole (DIFLUCAN) 150 MG tablet Take 1 tablet (150 mg total) by mouth once for 1 dose. Take after finishing antibiotic 06/16/18 06/16/18  Koran Seabrook, Delorise RoyalsJonathan D, PA-C  metroNIDAZOLE (FLAGYL) 500 MG tablet Take 1 tablet (500 mg total) by mouth 2 (two) times daily. 06/16/18   Wayde Gopaul, Delorise RoyalsJonathan D, PA-C  norethindrone-ethinyl estradiol (MICROGESTIN) 1-20 MG-MCG tablet Take 1 tablet by mouth daily. 05/02/18   Farrel ConnersGutierrez, Colleen, CNM  ondansetron (ZOFRAN ODT) 4 MG disintegrating tablet Take 1 tablet (4 mg total) by mouth every 8 (eight) hours as needed for nausea or vomiting. 09/12/17   Sharman CheekStafford, Phillip, MD  valACYclovir (VALTREX) 500 MG tablet TAKE 1 TABLET BY MOUTH TWICE DAILY FOR 3 DAYS 07/13/17   Copland, Ilona SorrelAlicia B, PA-C    Allergies Amoxicillin  Family History  Problem Relation Age of  Onset  . Hypertension Mother   . Cancer Maternal Uncle 28       BRAIN STAGE 4  . Hypertension Paternal Uncle   . Hypertension Maternal Grandmother   . Hypertension Paternal Grandfather   . Breast cancer Other     Social History Social History   Tobacco Use  . Smoking status: Current Every Day Smoker  . Smokeless tobacco: Never Used  Substance Use Topics  . Alcohol use: Not Currently  . Drug use: No     Review of Systems  Constitutional: No fever/chills Eyes: No visual changes. No discharge ENT: No upper respiratory complaints. Cardiovascular: no chest pain. Respiratory: no cough. No SOB. Gastrointestinal: No abdominal pain.  No nausea, no vomiting.  No diarrhea.  No constipation. Genitourinary: Negative for dysuria. No hematuria.  Positive for vaginal itching, discharge, odor Musculoskeletal: Negative for musculoskeletal pain. Skin: Negative for rash, abrasions, lacerations, ecchymosis. Neurological: Negative for headaches, focal weakness or numbness. 10-point ROS otherwise negative.  ____________________________________________   PHYSICAL EXAM:  VITAL SIGNS: ED Triage Vitals  Enc Vitals Group     BP 06/16/18 1810 119/64     Pulse Rate 06/16/18 1810 68     Resp --      Temp 06/16/18 1810 98.2 F (36.8 C)     Temp Source 06/16/18 1810 Oral     SpO2 06/16/18 1810 100 %     Weight 06/16/18 1811 160 lb (72.6 kg)     Height 06/16/18 1811 5\' 2"  (1.575 m)  Head Circumference --      Peak Flow --      Pain Score 06/16/18 1817 1     Pain Loc --      Pain Edu? --      Excl. in GC? --      Constitutional: Alert and oriented. Well appearing and in no acute distress. Eyes: Conjunctivae are normal. PERRL. EOMI. Head: Atraumatic. Neck: No stridor.    Cardiovascular: Normal rate, regular rhythm. Normal S1 and S2.  Good peripheral circulation. Respiratory: Normal respiratory effort without tachypnea or retractions. Lungs CTAB. Good air entry to the bases with no  decreased or absent breath sounds. Gastrointestinal: Bowel sounds 4 quadrants. Soft and nontender to palpation. No guarding or rigidity. No palpable masses. No distention. No CVA tenderness. Genitourinary: External genital exam reveals no visible abnormality or lesions.  Exam of speculum reveals thick white discharge in the vaginal vault.  No vaginal tears or lesions.  Cervix is unremarkable.  Bimanual exam reveals no cervical motion tenderness.  No tenderness to palpation in bilateral adnexa.  No palpable masses or lesions with palpation of the uterus or bilateral adnexa. Musculoskeletal: Full range of motion to all extremities. No gross deformities appreciated. Neurologic:  Normal speech and language. No gross focal neurologic deficits are appreciated.  Skin:  Skin is warm, dry and intact. No rash noted. Psychiatric: Mood and affect are normal. Speech and behavior are normal. Patient exhibits appropriate insight and judgement.  Pelvic exam chaperoned by female ED tech ____________________________________________   LABS (all labs ordered are listed, but only abnormal results are displayed)  Labs Reviewed  WET PREP, GENITAL - Abnormal; Notable for the following components:      Result Value   Clue Cells Wet Prep HPF POC PRESENT (*)    WBC, Wet Prep HPF POC FEW (*)    All other components within normal limits  URINALYSIS, COMPLETE (UACMP) WITH MICROSCOPIC - Abnormal; Notable for the following components:   Color, Urine YELLOW (*)    APPearance CLEAR (*)    Leukocytes, UA TRACE (*)    Bacteria, UA RARE (*)    All other components within normal limits  CHLAMYDIA/NGC RT PCR ([facility] ONLY)  POC URINE PREG, ED  POCT PREGNANCY, URINE   ____________________________________________  EKG   ____________________________________________  RADIOLOGY   No results found.  ____________________________________________    PROCEDURES  Procedure(s) performed:     Procedures    Medications  metroNIDAZOLE (FLAGYL) tablet 500 mg (has no administration in time range)     ____________________________________________   INITIAL IMPRESSION / ASSESSMENT AND PLAN / ED COURSE  Pertinent labs & imaging results that were available during my care of the patient were reviewed by me and considered in my medical decision making (see chart for details).  Review of the [place] CSRS was performed in accordance of the [facility] prior to dispensing any controlled drugs.      Patient's diagnosis is consistent with bacterial vaginosis.  Patient presents the emergency department complaining of vaginal itching, discharge.  Patient also requested testing for gonorrhea and chlamydia which returns negative.  Results return with indications of bacterial vaginosis.  Patient is treated for same with metronidazole.  Follow-up with primary care OB/GYN.  Patient is given ED precautions to return to the ED for any worsening or new symptoms.     ____________________________________________  FINAL CLINICAL IMPRESSION(S) / ED DIAGNOSES  Final diagnoses:  BV (bacterial vaginosis)      NEW MEDICATIONS STARTED DURING THIS VISIT:  ED Discharge Orders         Ordered    metroNIDAZOLE (FLAGYL) 500 MG tablet  2 times daily     06/16/18 2325    fluconazole (DIFLUCAN) 150 MG tablet   Once     06/16/18 2325              This chart was dictated using voice recognition software/Dragon. Despite best efforts to proofread, errors can occur which can change the meaning. Any change was purely unintentional.    Racheal Patches, PA-C 06/17/18 0003    Don Perking, [place], MD 06/17/18 [phone]

## 2018-08-17 ENCOUNTER — Other Ambulatory Visit: Payer: Self-pay | Admitting: Certified Nurse Midwife

## 2018-08-17 DIAGNOSIS — Z3041 Encounter for surveillance of contraceptive pills: Secondary | ICD-10-CM

## 2018-09-28 ENCOUNTER — Other Ambulatory Visit (HOSPITAL_COMMUNITY)
Admission: RE | Admit: 2018-09-28 | Discharge: 2018-09-28 | Disposition: A | Discharge status: Home / Self Care | Payer: Medicaid Other | Source: Ambulatory Visit | Attending: Obstetrics and Gynecology | Admitting: Obstetrics and Gynecology

## 2018-09-28 ENCOUNTER — Other Ambulatory Visit: Payer: Self-pay

## 2018-09-28 ENCOUNTER — Encounter: Payer: Self-pay | Admitting: Obstetrics and Gynecology

## 2018-09-28 ENCOUNTER — Ambulatory Visit (INDEPENDENT_AMBULATORY_CARE_PROVIDER_SITE_OTHER): Payer: Medicaid Other | Admitting: Obstetrics and Gynecology

## 2018-09-28 VITALS — BP 114/64 | HR 81 | Ht 62.0 in | Wt 166.0 lb

## 2018-09-28 DIAGNOSIS — Z0000 Encounter for general adult medical examination without abnormal findings: Secondary | ICD-10-CM

## 2018-09-28 DIAGNOSIS — Z124 Encounter for screening for malignant neoplasm of cervix: Secondary | ICD-10-CM | POA: Diagnosis present

## 2018-09-28 DIAGNOSIS — Z113 Encounter for screening for infections with a predominantly sexual mode of transmission: Secondary | ICD-10-CM | POA: Diagnosis present

## 2018-09-28 DIAGNOSIS — Z3041 Encounter for surveillance of contraceptive pills: Secondary | ICD-10-CM

## 2018-09-28 DIAGNOSIS — Z01419 Encounter for gynecological examination (general) (routine) without abnormal findings: Secondary | ICD-10-CM

## 2018-09-28 MED ORDER — NORETHINDRONE ACET-ETHINYL EST 1-20 MG-MCG PO TABS: 1.0000 | ORAL_TABLET | Freq: Every day | ORAL | 3 refills | Status: DC

## 2018-09-28 NOTE — Patient Instructions (Signed)
I value your feedback and entrusting us with your care. If you get a [facility] patient survey, I would appreciate you taking the time to let us know about your experience today. Thank you! 

## 2018-09-28 NOTE — Progress Notes (Addendum)
PCP:  [facility]   Chief Complaint  Patient presents with  . Gynecologic Exam     HPI:      Jane Bowman is a 28 y.o. No obstetric history on file. who LMP was Patient's last menstrual period was 09/21/2018 (approximate)., presents today for her annual examination.  Her menses are regular every 28-30 days,  lasting 3 days.  Dysmenorrhea none. She does not have intermenstrual bleeding. OCPs changed last yr due to menses on 3rd wk of pills. Sx resolved after OCP change. Doing well.  Sex activity: single partner, contraception - OCP (estrogen/progesterone).  Last Pap: 04/07/17  Results were: no abnormalities  Hx of STDs: chlamydia, HSV--takes valtrex prn sx, doesn't need RF currently.  There is a FH of breast cancer in her mat grt aunt, genetic testing not indicated. There is no FH of ovarian cancer. The patient does not do self-breast exams.  Tobacco use: The patient currently smokes 1/2 packs of cigarettes per day for the past many years.  Not interested in quitting.  Alcohol use: none No drug use.  Exercise: not active  She does not get adequate calcium and Vitamin D in her diet.   Past Medical History:  Diagnosis Date  . Chlamydia   . Genital herpes   . H/O: C-section     Past Surgical History:  Procedure Laterality Date  . CESAREAN SECTION  2015   CCW  . INDUCED ABORTION  2013    Family History  Problem Relation Age of Onset  . Hypertension Mother   . Hypertension Father   . Cancer Maternal Uncle 28       BRAIN STAGE 4  . Hypertension Paternal Uncle   . Hypertension Maternal Grandmother   . Hypertension Paternal Grandfather   . Breast cancer Other     Social History   Socioeconomic History  . Marital status: Single    Spouse name: Not on file  . Number of children: Not on file  . Years of education: Not on file  . Highest education level: Not on file  Occupational History  . Not on file  Social Needs  . Financial  resource strain: Not on file  . Food insecurity:    Worry: Not on file    Inability: Not on file  . Transportation needs:    Medical: Not on file    Non-medical: Not on file  Tobacco Use  . Smoking status: Current Every Day Smoker  . Smokeless tobacco: Never Used  Substance and Sexual Activity  . Alcohol use: Not Currently  . Drug use: No  . Sexual activity: Yes    Birth control/protection: Pill  Lifestyle  . Physical activity:    Days per week: Not on file    Minutes per session: Not on file  . Stress: Not on file  Relationships  . Social connections:    Talks on phone: Not on file    Gets together: Not on file    Attends religious service: Not on file    Active member of club or organization: Not on file    Attends meetings of clubs or organizations: Not on file    Relationship status: Not on file  . Intimate partner violence:    Fear of current or ex partner: Not on file    Emotionally abused: Not on file    Physically abused: Not on file    Forced sexual activity: Not on file  Other Topics  Concern  . Not on file  Social History Narrative  . Not on file    Current Meds  Medication Sig  . norethindrone-ethinyl estradiol (MICROGESTIN,JUNEL,LOESTRIN) 1-20 MG-MCG tablet Take 1 tablet by mouth daily.  . valACYclovir (VALTREX) 500 MG tablet TAKE 1 TABLET BY MOUTH TWICE DAILY FOR 3 DAYS  . [DISCONTINUED] norethindrone-ethinyl estradiol (MICROGESTIN,JUNEL,LOESTRIN) 1-20 MG-MCG tablet TAKE 1 TABLET BY MOUTH DAILY     ROS:  Review of Systems  Constitutional: Negative for fatigue, fever and unexpected weight change.  Respiratory: Negative for cough, shortness of breath and wheezing.   Cardiovascular: Negative for chest pain, palpitations and leg swelling.  Gastrointestinal: Negative for blood in stool, constipation, diarrhea, nausea and vomiting.  Endocrine: Negative for cold intolerance, heat intolerance and polyuria.  Genitourinary: Negative for dyspareunia, dysuria,  flank pain, frequency, genital sores, hematuria, menstrual problem, pelvic pain, urgency, vaginal bleeding, vaginal discharge and vaginal pain.  Musculoskeletal: Negative for back pain, joint swelling and myalgias.  Skin: Negative for rash.  Neurological: Negative for dizziness, syncope, light-headedness, numbness and headaches.  Hematological: Negative for adenopathy.  Psychiatric/Behavioral: Negative for agitation, confusion, sleep disturbance and suicidal ideas. The patient is not nervous/anxious.      Objective: BP 114/64   Pulse 81   Ht 5\' 2"  (1.575 m)   Wt 166 lb (75.3 kg)   LMP 09/21/2018 (Approximate)   BMI 30.36 kg/m    Physical Exam Constitutional:      Appearance: She is well-developed.  Genitourinary:     Vulva, vagina, uterus, right adnexa and left adnexa normal.     No vulval lesion or tenderness noted.     No vaginal discharge, erythema or tenderness.     No cervical motion tenderness or polyp.     Uterus is not enlarged or tender.     No right or left adnexal mass present.     Right adnexa not tender.     Left adnexa not tender.  Neck:     Musculoskeletal: Normal range of motion.     Thyroid: No thyromegaly.  Cardiovascular:     Rate and Rhythm: Normal rate and regular rhythm.     Heart sounds: Normal heart sounds. No murmur.  Pulmonary:     Effort: Pulmonary effort is normal.     Breath sounds: Normal breath sounds.  Chest:     Breasts:        Right: No mass, nipple discharge, skin change or tenderness.        Left: No mass, nipple discharge, skin change or tenderness.  Abdominal:     Palpations: Abdomen is soft.     Tenderness: There is no abdominal tenderness. There is no guarding.  Musculoskeletal: Normal range of motion.  Neurological:     General: No focal deficit present.     Mental Status: She is alert and oriented to person, place, and time.     Cranial Nerves: No cranial nerve deficit.  Skin:    General: Skin is warm and dry.   Psychiatric:        Mood and Affect: Mood normal.        Behavior: Behavior normal.        Thought Content: Thought content normal.        Judgment: Judgment normal.  Vitals signs reviewed.     Assessment/Plan: Encounter for annual routine gynecological examination  Cervical cancer screening - Plan: Cytology - PAP  Screening for STD (sexually transmitted disease) - Plan: Cytology - PAP  Encounter  for surveillance of contraceptive pills - OCP RF - Plan: norethindrone-ethinyl estradiol (MICROGESTIN,JUNEL,LOESTRIN) 1-20 MG-MCG tablet  Meds ordered this encounter  Medications  . norethindrone-ethinyl estradiol (MICROGESTIN,JUNEL,LOESTRIN) 1-20 MG-MCG tablet    Sig: Take 1 tablet by mouth daily.    Dispense:  84 tablet    Refill:  3    Order Specific Question:   Supervising Provider    Answer:   Nadara MustardHARRIS, ROBERT P [[id number]]             GYN counsel adequate intake of calcium and vitamin D, diet and exercise, tobacco cessation.     F/U  Return in about 1 year (around 09/28/2019).  Greydis Stlouis B. Daneesha Quinteros, PA-C 09/28/2018 11:26 AM

## 2018-09-30 LAB — CYTOLOGY - PAP
CHLAMYDIA, DNA PROBE: NEGATIVE
Diagnosis: NEGATIVE
Neisseria Gonorrhea: NEGATIVE

## 2019-06-22 ENCOUNTER — Other Ambulatory Visit (HOSPITAL_COMMUNITY)
Admission: RE | Admit: 2019-06-22 | Discharge: 2019-06-22 | Disposition: A | Discharge status: Home / Self Care | Payer: Medicaid Other | Source: Ambulatory Visit | Attending: Obstetrics and Gynecology | Admitting: Obstetrics and Gynecology

## 2019-06-22 ENCOUNTER — Ambulatory Visit (INDEPENDENT_AMBULATORY_CARE_PROVIDER_SITE_OTHER): Payer: Medicaid Other | Admitting: Obstetrics and Gynecology

## 2019-06-22 ENCOUNTER — Other Ambulatory Visit: Payer: Self-pay

## 2019-06-22 ENCOUNTER — Encounter: Payer: Self-pay | Admitting: Obstetrics and Gynecology

## 2019-06-22 VITALS — BP 120/70 | Ht 62.0 in | Wt 177.0 lb

## 2019-06-22 DIAGNOSIS — Z113 Encounter for screening for infections with a predominantly sexual mode of transmission: Secondary | ICD-10-CM | POA: Insufficient documentation

## 2019-06-22 NOTE — Patient Instructions (Signed)
I value your feedback and entrusting us with your care. If you get a [facility] patient survey, I would appreciate you taking the time to let us know about your experience today. Thank you! 

## 2019-06-22 NOTE — Progress Notes (Signed)
[facility]   Chief Complaint  Patient presents with  . STD screening    HPI:      [name] is a 28 y.o. [id number] who LMP was Patient's last menstrual period was 06/22/2019 (exact date)., presents today for STD testing. No sx/known exposures but wants to be safe. She is sex active, on OCPs. Has a new partner. Neg pap/STD testing 3/20. Hx of chlamydia and HSV. No urin sx, LBP, pelvic pain, fevers.  Past Medical History:  Diagnosis Date  . Chlamydia   . Genital herpes   . H/O: C-section     Past Surgical History:  Procedure Laterality Date  . CESAREAN SECTION  2015   CCW  . INDUCED ABORTION  2013    Family History  Problem Relation Age of Onset  . Hypertension Mother   . Hypertension Father   . Cancer Maternal Uncle 28       BRAIN STAGE 4  . Hypertension Paternal Uncle   . Hypertension Maternal Grandmother   . Hypertension Paternal Grandfather   . Breast cancer Other     Social History   Socioeconomic History  . Marital status: Single    Spouse name: Not on file  . Number of children: Not on file  . Years of education: Not on file  . Highest education level: Not on file  Occupational History  . Not on file  Social Needs  . Financial resource strain: Not on file  . Food insecurity    Worry: Not on file    Inability: Not on file  . Transportation needs    Medical: Not on file    Non-medical: Not on file  Tobacco Use  . Smoking status: Current Every Day Smoker  . Smokeless tobacco: Never Used  Substance and Sexual Activity  . Alcohol use: Not Currently  . Drug use: No  . Sexual activity: Yes    Birth control/protection: Pill  Lifestyle  . Physical activity    Days per week: Not on file    Minutes per session: Not on file  . Stress: Not on file  Relationships  . Social Musician on phone: Not on file    Gets together: Not on file    Attends religious service: Not on file    Active member of club  or organization: Not on file    Attends meetings of clubs or organizations: Not on file    Relationship status: Not on file  . Intimate partner violence    Fear of current or ex partner: Not on file    Emotionally abused: Not on file    Physically abused: Not on file    Forced sexual activity: Not on file  Other Topics Concern  . Not on file  Social History Narrative  . Not on file    Outpatient Medications Prior to Visit  Medication Sig Dispense Refill  . norethindrone-ethinyl estradiol (MICROGESTIN,JUNEL,LOESTRIN) 1-20 MG-MCG tablet Take 1 tablet by mouth daily. 84 tablet 3  . valACYclovir (VALTREX) 500 MG tablet TAKE 1 TABLET BY MOUTH TWICE DAILY FOR 3 DAYS 24 tablet 0   No facility-administered medications prior to visit.       ROS:  Review of Systems  Constitutional: Negative for fever.  Gastrointestinal: Negative for blood in stool, constipation, diarrhea, nausea and vomiting.  Genitourinary: Negative for dyspareunia, dysuria, flank pain, frequency, hematuria, urgency, vaginal bleeding, vaginal discharge and vaginal pain.  Musculoskeletal: Negative  for back pain.  Skin: Negative for rash.   OBJECTIVE:   Vitals:  BP 120/70   Ht 5\' 2"  (1.575 m)   Wt 177 lb (80.3 kg)   LMP 06/22/2019 (Exact Date)   BMI 32.37 kg/m   Physical Exam Vitals signs reviewed.  Constitutional:      Appearance: She is well-developed.  Neck:     Musculoskeletal: Normal range of motion.  Pulmonary:     Effort: Pulmonary effort is normal.  Genitourinary:    General: Normal vulva.     Pubic Area: No rash.      Labia:        Right: No rash, tenderness or lesion.        Left: No rash, tenderness or lesion.      Vagina: Normal. No vaginal discharge, erythema or tenderness.     Cervix: Normal.     Uterus: Normal. Not enlarged and not tender.      Adnexa: Right adnexa normal and left adnexa normal.       Right: No mass or tenderness.         Left: No mass or tenderness.     Musculoskeletal: Normal range of motion.  Skin:    General: Skin is warm and dry.  Neurological:     General: No focal deficit present.     Mental Status: She is alert and oriented to person, place, and time.  Psychiatric:        Mood and Affect: Mood normal.        Behavior: Behavior normal.        Thought Content: Thought content normal.        Judgment: Judgment normal.     Assessment/Plan: Screening for STD (sexually transmitted disease) - Plan: [facility] STD, HIV, RPR, Hepatitis C antibody; Will call with results if not on mychart. F/u prn.     Return if symptoms worsen or fail to improve.  Alicia B. Copland, PA-C 06/22/2019 4:03 PM

## 2019-06-23 LAB — HIV ANTIBODY (ROUTINE TESTING W REFLEX): HIV Screen 4th Generation wRfx: NONREACTIVE

## 2019-06-23 LAB — RPR: RPR Ser Ql: NONREACTIVE

## 2019-06-23 LAB — HEPATITIS C ANTIBODY: Hep C Virus Ab: <0.1 s/co ratio (ref 0.0–0.9)

## 2019-06-23 NOTE — Progress Notes (Signed)
Pls let pt know blood STD testing neg. Still waiting on gon/chlam/trich. Thx

## 2019-06-23 NOTE — Progress Notes (Signed)
Pt aware.

## 2019-06-24 LAB — CERVICOVAGINAL ANCILLARY ONLY
Chlamydia: NEGATIVE
Comment: NEGATIVE
Comment: NEGATIVE
Comment: NORMAL
Neisseria Gonorrhea: NEGATIVE
Trichomonas: NEGATIVE

## 2019-06-24 NOTE — Progress Notes (Signed)
Pls let pt know gon/chlam/trich neg. Thx.

## 2019-06-27 NOTE — Progress Notes (Signed)
Pt aware.

## 2019-08-25 ENCOUNTER — Other Ambulatory Visit: Payer: Self-pay

## 2019-08-25 ENCOUNTER — Encounter (HOSPITAL_COMMUNITY): Payer: Self-pay | Admitting: Emergency Medicine

## 2019-08-25 ENCOUNTER — Emergency Department (HOSPITAL_COMMUNITY)
Admission: EM | Admit: 2019-08-25 | Discharge: 2019-08-25 | Disposition: A | Discharge status: Home / Self Care | Payer: Medicaid Other | Attending: Emergency Medicine | Admitting: Emergency Medicine

## 2019-08-25 DIAGNOSIS — S01111A Laceration without foreign body of right eyelid and periocular area, initial encounter: Secondary | ICD-10-CM | POA: Insufficient documentation

## 2019-08-25 DIAGNOSIS — Z5321 Procedure and treatment not carried out due to patient leaving prior to being seen by health care provider: Secondary | ICD-10-CM | POA: Insufficient documentation

## 2019-08-25 DIAGNOSIS — Y9289 Other specified places as the place of occurrence of the external cause: Secondary | ICD-10-CM | POA: Diagnosis not present

## 2019-08-25 DIAGNOSIS — Y9389 Activity, other specified: Secondary | ICD-10-CM | POA: Insufficient documentation

## 2019-08-25 DIAGNOSIS — W500XXA Accidental hit or strike by another person, initial encounter: Secondary | ICD-10-CM | POA: Insufficient documentation

## 2019-08-25 DIAGNOSIS — Y999 Unspecified external cause status: Secondary | ICD-10-CM | POA: Insufficient documentation
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
## 2019-08-25 NOTE — ED Notes (Signed)
Pt agitated with the wait time at this moment and wondering how much longer it's going to be until she can go back.

## 2019-08-31 ENCOUNTER — Ambulatory Visit (INDEPENDENT_AMBULATORY_CARE_PROVIDER_SITE_OTHER): Payer: Medicaid Other | Admitting: Obstetrics and Gynecology

## 2019-08-31 ENCOUNTER — Encounter: Payer: Self-pay | Admitting: Obstetrics and Gynecology

## 2019-08-31 ENCOUNTER — Other Ambulatory Visit (HOSPITAL_COMMUNITY)
Admission: RE | Admit: 2019-08-31 | Discharge: 2019-08-31 | Disposition: A | Discharge status: Home / Self Care | Payer: Medicaid Other | Source: Ambulatory Visit | Attending: Obstetrics and Gynecology | Admitting: Obstetrics and Gynecology

## 2019-08-31 ENCOUNTER — Other Ambulatory Visit: Payer: Self-pay

## 2019-08-31 VITALS — BP 124/90 | Ht 62.0 in | Wt 176.0 lb

## 2019-08-31 DIAGNOSIS — N898 Other specified noninflammatory disorders of vagina: Secondary | ICD-10-CM | POA: Insufficient documentation

## 2019-08-31 DIAGNOSIS — Z113 Encounter for screening for infections with a predominantly sexual mode of transmission: Secondary | ICD-10-CM | POA: Insufficient documentation

## 2019-08-31 LAB — POCT WET PREP WITH KOH
Clue Cells Wet Prep HPF POC: NEGATIVE
KOH Prep POC: NEGATIVE
Trichomonas, UA: NEGATIVE
Yeast Wet Prep HPF POC: NEGATIVE

## 2019-08-31 MED ORDER — METRONIDAZOLE 500 MG PO TABS: 500.0000 mg | ORAL_TABLET | Freq: Two times a day (BID) | ORAL | 0 refills | Status: AC

## 2019-08-31 NOTE — Progress Notes (Signed)
[facility]   Chief Complaint  Patient presents with  . STD testing  . Vaginal Discharge    fishy/sour odor, no itchines x 1 week    HPI:      [name] is a 29 y.o. [id number] who LMP was Patient's last menstrual period was 08/20/2019 (approximate)., presents today for STD testing and increased vag d/c with odor, no irritation, for the past wk. Hx of BV in past. Pt is sex active with new partner since 12/20, using condoms and OCPs. No known STD exposure. Neg full testing 12/20. Hx of HSV, takes valtrex prn. No pelvic pain, LBP, fevers, urin sx. No recent abx use.    Past Medical History:  Diagnosis Date  . Chlamydia   . Genital herpes   . H/O: C-section     Past Surgical History:  Procedure Laterality Date  . CESAREAN SECTION  2015   CCW  . INDUCED ABORTION  2013    Family History  Problem Relation Age of Onset  . Hypertension Mother   . Hypertension Father   . Cancer Maternal Uncle 28       BRAIN STAGE 4  . Hypertension Paternal Uncle   . Hypertension Maternal Grandmother   . Hypertension Paternal Grandfather   . Breast cancer Other     Social History   Socioeconomic History  . Marital status: Single    Spouse name: Not on file  . Number of children: Not on file  . Years of education: Not on file  . Highest education level: Not on file  Occupational History  . Not on file  Tobacco Use  . Smoking status: Current Every Day Smoker  . Smokeless tobacco: Never Used  Substance and Sexual Activity  . Alcohol use: Not Currently  . Drug use: No  . Sexual activity: Yes    Birth control/protection: Pill  Other Topics Concern  . Not on file  Social History Narrative  . Not on file   Social Determinants of Health   Financial Resource Strain:   . Difficulty of Paying Living Expenses: Not on file  Food Insecurity:   . Worried About Charity fundraiser in the Last Year: Not on file  . Ran Out of Food in the Last Year: Not  on file  Transportation Needs:   . Lack of Transportation (Medical): Not on file  . Lack of Transportation (Non-Medical): Not on file  Physical Activity:   . Days of Exercise per Week: Not on file  . Minutes of Exercise per Session: Not on file  Stress:   . Feeling of Stress : Not on file  Social Connections:   . Frequency of Communication with Friends and Family: Not on file  . Frequency of Social Gatherings with Friends and Family: Not on file  . Attends Religious Services: Not on file  . Active Member of Clubs or Organizations: Not on file  . Attends Archivist Meetings: Not on file  . Marital Status: Not on file  Intimate Partner Violence:   . Fear of Current or Ex-Partner: Not on file  . Emotionally Abused: Not on file  . Physically Abused: Not on file  . Sexually Abused: Not on file    Outpatient Medications Prior to Visit  Medication Sig Dispense Refill  . norethindrone-ethinyl estradiol (MICROGESTIN,JUNEL,LOESTRIN) 1-20 MG-MCG tablet Take 1 tablet by mouth daily. 84 tablet 3  . valACYclovir (VALTREX) 500 MG tablet TAKE 1 TABLET BY  MOUTH TWICE DAILY FOR 3 DAYS 24 tablet 0   No facility-administered medications prior to visit.      ROS:  Review of Systems  Constitutional: Negative for fever.  Gastrointestinal: Negative for blood in stool, constipation, diarrhea, nausea and vomiting.  Genitourinary: Positive for vaginal discharge. Negative for dyspareunia, dysuria, flank pain, frequency, hematuria, urgency, vaginal bleeding and vaginal pain.  Musculoskeletal: Negative for back pain.  Skin: Negative for rash.   OBJECTIVE:   Vitals:  BP 124/90   Ht 5\' 2"  (1.575 m)   Wt 176 lb (79.8 kg)   LMP 08/20/2019 (Approximate)   BMI 32.19 kg/m   Physical Exam Vitals reviewed.  Constitutional:      Appearance: She is well-developed.  Pulmonary:     Effort: Pulmonary effort is normal.  Genitourinary:    General: Normal vulva.     Pubic Area: No rash.       Labia:        Right: No rash, tenderness or lesion.        Left: No rash, tenderness or lesion.      Vagina: Normal. No vaginal discharge, erythema or tenderness.     Cervix: Normal.     Uterus: Normal. Not enlarged and not tender.      Adnexa: Right adnexa normal and left adnexa normal.       Right: No mass or tenderness.         Left: No mass or tenderness.    Musculoskeletal:        General: Normal range of motion.     Cervical back: Normal range of motion.  Skin:    General: Skin is warm and dry.  Neurological:     General: No focal deficit present.     Mental Status: She is alert and oriented to person, place, and time.  Psychiatric:        Mood and Affect: Mood normal.        Behavior: Behavior normal.        Thought Content: Thought content normal.        Judgment: Judgment normal.     Results: Results for orders placed or performed in visit on 08/31/19 (from the past 24 hour(s))  POCT Wet Prep with KOH     Status: Normal   Collection Time: 08/31/19  3:11 PM  Result Value Ref Range   Trichomonas, UA Negative    Clue Cells Wet Prep HPF POC neg    Epithelial Wet Prep HPF POC     Yeast Wet Prep HPF POC neg    Bacteria Wet Prep HPF POC     RBC Wet Prep HPF POC     WBC Wet Prep HPF POC     KOH Prep POC Negative Negative     Assessment/Plan: Vaginal discharge - Plan: Cervicovaginal ancillary only, POCT Wet Prep with KOH; Neg wet prep/exam, pos sx. Treat empirically. Rx flagyl. No EtOH. Check STDs/BV on nuswab. F/u prn.  Screening for STD (sexually transmitted disease) - Plan: Cervicovaginal ancillary only, HIV Antibody (routine testing w rflx), RPR, Hepatitis C antibody    Return if symptoms worsen or fail to improve.  Omega Durante B. Mandeep Ferch, PA-C 08/31/2019 3:12 PM

## 2019-08-31 NOTE — Patient Instructions (Signed)
I value your feedback and entrusting us with your care. If you get a [facility] patient survey, I would appreciate you taking the time to let us know about your experience today. Thank you!  As of June 23, 2019, your lab results will be released to your MyChart immediately, before I even have a chance to see them. Please give me time to review them and contact you if there are any abnormalities. Thank you for your patience.  

## 2019-09-01 LAB — RPR: RPR Ser Ql: NONREACTIVE

## 2019-09-01 LAB — HIV ANTIBODY (ROUTINE TESTING W REFLEX): HIV Screen 4th Generation wRfx: NONREACTIVE

## 2019-09-01 LAB — HEPATITIS C ANTIBODY: Hep C Virus Ab: <0.1 s/co ratio (ref 0.0–0.9)

## 2019-09-02 LAB — CERVICOVAGINAL ANCILLARY ONLY
Bacterial Vaginitis (gardnerella): POSITIVE — AB
Chlamydia: NEGATIVE
Comment: NEGATIVE
Comment: NEGATIVE
Comment: NEGATIVE
Comment: NORMAL
Neisseria Gonorrhea: NEGATIVE
Trichomonas: NEGATIVE

## 2019-09-02 NOTE — Progress Notes (Signed)
Pls let pt know STD testing neg. Culture confirmed BV which we are treating her for. Thx.

## 2019-09-05 NOTE — Progress Notes (Signed)
Pt aware.

## 2019-11-09 ENCOUNTER — Ambulatory Visit: Payer: Medicaid Other | Admitting: Family Medicine

## 2019-11-13 ENCOUNTER — Other Ambulatory Visit: Payer: Self-pay | Admitting: Obstetrics and Gynecology

## 2019-11-13 DIAGNOSIS — Z3041 Encounter for surveillance of contraceptive pills: Secondary | ICD-10-CM

## 2019-11-13 MED ORDER — NORETHIN ACE-ETH ESTRAD-FE 1-20 MG-MCG PO TABS: 1.0000 | ORAL_TABLET | Freq: Every day | ORAL | 0 refills | Status: DC

## 2019-11-14 NOTE — Telephone Encounter (Signed)
Dr. Jean Rosenthal sent in RF. Pls notify pt she is past due for annual and have her schedule. Thx

## 2019-11-14 NOTE — Telephone Encounter (Signed)
Pt aware annual due. Pls call pt to schedule annual, thx.

## 2019-11-15 NOTE — Telephone Encounter (Signed)
Patient is schedule for 11/29/19 at 4pm with ABC

## 2019-11-29 ENCOUNTER — Other Ambulatory Visit (HOSPITAL_COMMUNITY)
Admission: RE | Admit: 2019-11-29 | Discharge: 2019-11-29 | Disposition: A | Discharge status: Home / Self Care | Payer: Medicaid Other | Source: Ambulatory Visit | Attending: Obstetrics and Gynecology | Admitting: Obstetrics and Gynecology

## 2019-11-29 ENCOUNTER — Encounter: Payer: Self-pay | Admitting: Obstetrics and Gynecology

## 2019-11-29 ENCOUNTER — Ambulatory Visit (INDEPENDENT_AMBULATORY_CARE_PROVIDER_SITE_OTHER): Payer: Medicaid Other | Admitting: Obstetrics and Gynecology

## 2019-11-29 ENCOUNTER — Other Ambulatory Visit: Payer: Self-pay

## 2019-11-29 VITALS — BP 100/80 | Ht 62.0 in | Wt 181.0 lb

## 2019-11-29 DIAGNOSIS — R0789 Other chest pain: Secondary | ICD-10-CM

## 2019-11-29 DIAGNOSIS — Z113 Encounter for screening for infections with a predominantly sexual mode of transmission: Secondary | ICD-10-CM

## 2019-11-29 DIAGNOSIS — Z0000 Encounter for general adult medical examination without abnormal findings: Secondary | ICD-10-CM

## 2019-11-29 DIAGNOSIS — Z3041 Encounter for surveillance of contraceptive pills: Secondary | ICD-10-CM

## 2019-11-29 DIAGNOSIS — Z01419 Encounter for gynecological examination (general) (routine) without abnormal findings: Secondary | ICD-10-CM

## 2019-11-29 DIAGNOSIS — A6004 Herpesviral vulvovaginitis: Secondary | ICD-10-CM | POA: Insufficient documentation

## 2019-11-29 MED ORDER — NORETHIN ACE-ETH ESTRAD-FE 1-20 MG-MCG PO TABS: 1.0000 | ORAL_TABLET | Freq: Every day | ORAL | 3 refills | Status: DC

## 2019-11-29 MED ORDER — VALACYCLOVIR HCL 500 MG PO TABS: 500.0000 mg | ORAL_TABLET | Freq: Two times a day (BID) | ORAL | 0 refills | Status: DC

## 2019-11-29 NOTE — Patient Instructions (Signed)
I value your feedback and entrusting us with your care. If you get a [facility] patient survey, I would appreciate you taking the time to let us know about your experience today. Thank you!  As of June 23, 2019, your lab results will be released to your MyChart immediately, before I even have a chance to see them. Please give me time to review them and contact you if there are any abnormalities. Thank you for your patience.  

## 2019-11-29 NOTE — Progress Notes (Signed)
PCP:  [facility] [place]   Chief Complaint  Patient presents with  . Gynecologic Exam  . STD testing     HPI:      [name] is a 29 y.o. No obstetric history on file. who LMP was Patient's last menstrual period was 11/08/2019 (approximate)., presents today for her annual examination.  Her menses are regular every 28-30 days,  lasting 3 days.  Dysmenorrhea none. She does not have intermenstrual bleeding.   Sex activity: single partner, contraception - OCP (estrogen/progesterone).  Last Pap: 09/28/18  Results were: no abnormalities  Hx of STDs: chlamydia, HSV--takes valtrex prn sx, needs RF currently. Neg STD testing 2/21 but would like done again to be safe. No known exposures.  Pt had BV 2/21, treated with flagyl. Sx resolved.   There is a FH of breast cancer in her mat grt aunt, genetic testing not indicated. There is no FH of ovarian cancer. The patient does not do self-breast exams.  Tobacco use: The patient currently smokes 1/2 packs of cigarettes per day for the past many years.  Trying to cut back. Alcohol use: none No drug use.  Exercise: min active  She does not get adequate calcium but not Vitamin D in her diet.  Went to ED 3/21 due to chest pain with neg eval. Still having some sx. Has been under increased stress/anxiety with covid. Not interested in any anxiety meds.    Past Medical History:  Diagnosis Date  . Chlamydia   . Genital herpes   . H/O: C-section     Past Surgical History:  Procedure Laterality Date  . CESAREAN SECTION  2015   CCW  . INDUCED ABORTION  2013    Family History  Problem Relation Age of Onset  . Hypertension Mother   . Hypertension Father   . Cancer Maternal Uncle 28       BRAIN STAGE 4  . Hypertension Paternal Uncle   . Hypertension Maternal Grandmother   . Hypertension Paternal Grandfather   . Breast cancer Other     Social History   Socioeconomic History  . Marital status: Single   Spouse name: Not on file  . Number of children: Not on file  . Years of education: Not on file  . Highest education level: Not on file  Occupational History  . Not on file  Tobacco Use  . Smoking status: Current Every Day Smoker  . Smokeless tobacco: Never Used  Substance and Sexual Activity  . Alcohol use: Not Currently  . Drug use: No  . Sexual activity: Yes    Birth control/protection: Pill  Other Topics Concern  . Not on file  Social History Narrative  . Not on file   Social Determinants of Health   Financial Resource Strain:   . Difficulty of Paying Living Expenses:   Food Insecurity:   . Worried About Charity fundraiser in the Last Year:   . Arboriculturist in the Last Year:   Transportation Needs:   . Film/video editor (Medical):   Marland Kitchen Lack of Transportation (Non-Medical):   Physical Activity:   . Days of Exercise per Week:   . Minutes of Exercise per Session:   Stress:   . Feeling of Stress :   Social Connections:   . Frequency of Communication with Friends and Family:   . Frequency of Social Gatherings with Friends and Family:   . Attends Religious Services:   .  Active Member of Clubs or Organizations:   . Attends Banker Meetings:   Marland Kitchen Marital Status:   Intimate Partner Violence:   . Fear of Current or Ex-Partner:   . Emotionally Abused:   Marland Kitchen Physically Abused:   . Sexually Abused:     Current Meds  Medication Sig  . norethindrone-ethinyl estradiol (JUNEL FE 1/20) 1-20 MG-MCG tablet Take 1 tablet by mouth daily.  . valACYclovir (VALTREX) 500 MG tablet Take 1 tablet (500 mg total) by mouth 2 (two) times daily. for 3 days  . [DISCONTINUED] norethindrone-ethinyl estradiol (JUNEL FE 1/20) 1-20 MG-MCG tablet Take 1 tablet by mouth daily.  . [DISCONTINUED] valACYclovir (VALTREX) 500 MG tablet TAKE 1 TABLET BY MOUTH TWICE DAILY FOR 3 DAYS     ROS:  Review of Systems  Constitutional: Positive for fatigue. Negative for fever and unexpected  weight change.  Respiratory: Negative for cough, shortness of breath and wheezing.   Cardiovascular: Positive for chest pain. Negative for palpitations and leg swelling.  Gastrointestinal: Negative for blood in stool, constipation, diarrhea, nausea and vomiting.  Endocrine: Negative for cold intolerance, heat intolerance and polyuria.  Genitourinary: Negative for dyspareunia, dysuria, flank pain, frequency, genital sores, hematuria, menstrual problem, pelvic pain, urgency, vaginal bleeding, vaginal discharge and vaginal pain.  Musculoskeletal: Negative for back pain, joint swelling and myalgias.  Skin: Negative for rash.  Neurological: Negative for dizziness, syncope, light-headedness, numbness and headaches.  Hematological: Negative for adenopathy.  Psychiatric/Behavioral: Negative for agitation, confusion, sleep disturbance and suicidal ideas. The patient is not nervous/anxious.      Objective: BP 100/80   Ht 5\' 2"  (1.575 m)   Wt 181 lb (82.1 kg)   LMP 11/08/2019 (Approximate)   BMI 33.11 kg/m    Physical Exam Constitutional:      Appearance: She is well-developed.  Genitourinary:     Vulva, vagina, uterus, right adnexa and left adnexa normal.     No vulval lesion or tenderness noted.     No vaginal discharge, erythema or tenderness.     No cervical motion tenderness or polyp.     Uterus is not enlarged or tender.     No right or left adnexal mass present.     Right adnexa not tender.     Left adnexa not tender.  Neck:     Thyroid: No thyromegaly.  Cardiovascular:     Rate and Rhythm: Normal rate and regular rhythm.     Heart sounds: Normal heart sounds. No murmur.  Pulmonary:     Effort: Pulmonary effort is normal.     Breath sounds: Normal breath sounds.  Chest:     Chest wall: Tenderness present. No mass or swelling.     Breasts:        Right: No mass, nipple discharge, skin change or tenderness.        Left: No mass, nipple discharge, skin change or tenderness.     Abdominal:     Palpations: Abdomen is soft.     Tenderness: There is no abdominal tenderness. There is no guarding.  Musculoskeletal:        General: Normal range of motion.     Cervical back: Normal range of motion.  Neurological:     General: No focal deficit present.     Mental Status: She is alert and oriented to person, place, and time.     Cranial Nerves: No cranial nerve deficit.  Skin:    General: Skin is warm and dry.  Psychiatric:        Mood and Affect: Mood normal.        Behavior: Behavior normal.        Thought Content: Thought content normal.        Judgment: Judgment normal.  Vitals reviewed.     Assessment/Plan:  Encounter for annual routine gynecological examination  Screening for STD (sexually transmitted disease) - Plan: HIV Antibody (routine testing w rflx), RPR, Hepatitis C antibody, Cervicovaginal ancillary only  Encounter for surveillance of contraceptive pills - Plan: norethindrone-ethinyl estradiol (JUNEL FE 1/20) 1-20 MG-MCG tablet; OCP RF  Herpes simplex vulvovaginitis - Plan: valACYclovir (VALTREX) 500 MG tablet; Rx RF valtrex prn sx  Other chest pain--tender on chest wall. Most likely MSK. NSAIDs/ice packs. Also could be anxiety related. F/u prn    Meds ordered this encounter  Medications  . norethindrone-ethinyl estradiol (JUNEL FE 1/20) 1-20 MG-MCG tablet    Sig: Take 1 tablet by mouth daily.    Dispense:  3 Package    Refill:  3    Order Specific Question:   Supervising Provider    Answer:   Nadara Mustard [id number]  . valACYclovir (VALTREX) 500 MG tablet    Sig: Take 1 tablet (500 mg total) by mouth 2 (two) times daily. for 3 days    Dispense:  30 tablet    Refill:  0    Order Specific Question:   Supervising Provider    Answer:   Nadara Mustard [[id number]]             GYN counsel adequate intake of calcium and vitamin D, diet and exercise, tobacco cessation.     F/U  Return in about 1 year (around 11/28/2020).  Negin Hegg B.  Edenilson Austad, PA-C 11/29/2019 4:20 PM

## 2019-11-30 LAB — HIV ANTIBODY (ROUTINE TESTING W REFLEX): HIV Screen 4th Generation wRfx: NONREACTIVE

## 2019-11-30 LAB — HEPATITIS C ANTIBODY: Hep C Virus Ab: <0.1 s/co ratio (ref 0.0–0.9)

## 2019-11-30 LAB — RPR: RPR Ser Ql: NONREACTIVE

## 2019-12-01 ENCOUNTER — Telehealth: Payer: Self-pay | Admitting: Obstetrics and Gynecology

## 2019-12-01 DIAGNOSIS — A549 Gonococcal infection, unspecified: Secondary | ICD-10-CM | POA: Insufficient documentation

## 2019-12-01 LAB — CERVICOVAGINAL ANCILLARY ONLY
Chlamydia: NEGATIVE
Comment: NEGATIVE
Comment: NEGATIVE
Comment: NORMAL
Neisseria Gonorrhea: POSITIVE — AB
Trichomonas: NEGATIVE

## 2019-12-01 MED ORDER — CEFTRIAXONE SODIUM 250 MG IJ SOLR
250.0000 mg | Freq: Once | INTRAMUSCULAR | Status: AC
  Administered 2019-12-02: 250 mg via INTRAMUSCULAR

## 2019-12-01 NOTE — Telephone Encounter (Signed)
LMTRC re: positive gonorrhea on STD testing.

## 2019-12-01 NOTE — Telephone Encounter (Signed)
Pt aware of gon. RTO tomorrow for Rocephin inj with RN.  Partner needs tx. RTO in 4 wks TOC. RN to notify [facility].

## 2019-12-02 ENCOUNTER — Other Ambulatory Visit: Payer: Self-pay

## 2019-12-02 ENCOUNTER — Ambulatory Visit (INDEPENDENT_AMBULATORY_CARE_PROVIDER_SITE_OTHER): Payer: Medicaid Other

## 2019-12-02 DIAGNOSIS — A549 Gonococcal infection, unspecified: Secondary | ICD-10-CM

## 2019-12-02 NOTE — Telephone Encounter (Signed)
[facility] notified. 

## 2019-12-02 NOTE — Progress Notes (Signed)
Patient presents today for Rocephin injection. Given IM RUOQ. Patient tolerated well. 

## 2019-12-09 ENCOUNTER — Other Ambulatory Visit: Payer: Self-pay | Admitting: Obstetrics and Gynecology

## 2019-12-09 ENCOUNTER — Telehealth: Payer: Self-pay

## 2019-12-09 DIAGNOSIS — Z3041 Encounter for surveillance of contraceptive pills: Secondary | ICD-10-CM

## 2019-12-09 MED ORDER — NORETHINDRONE ACET-ETHINYL EST 1-20 MG-MCG PO TABS: 1.0000 | ORAL_TABLET | Freq: Every day | ORAL | 3 refills | Status: DC

## 2019-12-09 NOTE — Telephone Encounter (Signed)
Spoke w/pharamcist. Advised patient had been on Microgestin, Junel, Loestrin 1/20. The most recent rx sent was for [facility] 1/20 FE. Verbal given to change rx per [facility] to what was getting.

## 2019-12-09 NOTE — Telephone Encounter (Signed)
Patient aware.

## 2019-12-09 NOTE — Telephone Encounter (Signed)
Patient spoke w/pharmacy about her OCP. The one she is on now she does not like it. She would like to switch back to the one she has been taking for a couple years now. She was advised to contact us to get rx sent in. They gave her the NDC#[id number] (#21 pack) She doesn't like the #28 pack. It doesn't make her feel good. [phone]

## 2019-12-30 ENCOUNTER — Other Ambulatory Visit (HOSPITAL_COMMUNITY)
Admission: RE | Admit: 2019-12-30 | Discharge: 2019-12-30 | Disposition: A | Discharge status: Home / Self Care | Payer: Medicaid Other | Source: Ambulatory Visit | Attending: Obstetrics and Gynecology | Admitting: Obstetrics and Gynecology

## 2019-12-30 ENCOUNTER — Other Ambulatory Visit: Payer: Self-pay

## 2019-12-30 ENCOUNTER — Encounter: Payer: Self-pay | Admitting: Obstetrics and Gynecology

## 2019-12-30 ENCOUNTER — Ambulatory Visit (INDEPENDENT_AMBULATORY_CARE_PROVIDER_SITE_OTHER): Payer: Medicaid Other | Admitting: Obstetrics and Gynecology

## 2019-12-30 VITALS — BP 100/60 | Ht 62.0 in | Wt 185.0 lb

## 2019-12-30 DIAGNOSIS — A549 Gonococcal infection, unspecified: Secondary | ICD-10-CM | POA: Diagnosis present

## 2019-12-30 DIAGNOSIS — Z113 Encounter for screening for infections with a predominantly sexual mode of transmission: Secondary | ICD-10-CM

## 2019-12-30 NOTE — Patient Instructions (Signed)
I value your feedback and entrusting us with your care. If you get a [facility] patient survey, I would appreciate you taking the time to let us know about your experience today. Thank you!  As of June 23, 2019, your lab results will be released to your MyChart immediately, before I even have a chance to see them. Please give me time to review them and contact you if there are any abnormalities. Thank you for your patience.  

## 2019-12-30 NOTE — Progress Notes (Signed)
Chief Complaint  Patient presents with  . Follow-up    TOC     HPI:      [name] is a 29 y.o. [id number] who LMP was Patient's last menstrual period was 12/02/2019 (approximate)., presents today for STD test of cure. She was diagnosed with GC 1 mo ago. She was treated with rocephin. She is unsure if partner treated, no longer with him. She denies any further symptoms.    Past Medical History:  Diagnosis Date  . Chlamydia   . Genital herpes   . H/O: C-section     Social History   Socioeconomic History  . Marital status: Single    Spouse name: Not on file  . Number of children: Not on file  . Years of education: Not on file  . Highest education level: Not on file  Occupational History  . Not on file  Tobacco Use  . Smoking status: Current Every Day Smoker  . Smokeless tobacco: Never Used  Vaping Use  . Vaping Use: Never used  Substance and Sexual Activity  . Alcohol use: Not Currently  . Drug use: No  . Sexual activity: Yes    Birth control/protection: Pill  Other Topics Concern  . Not on file  Social History Narrative  . Not on file   Social Determinants of Health   Financial Resource Strain:   . Difficulty of Paying Living Expenses:   Food Insecurity:   . Worried About Programme researcher, broadcasting/film/video in the Last Year:   . Barista in the Last Year:   Transportation Needs:   . Freight forwarder (Medical):   Marland Kitchen Lack of Transportation (Non-Medical):   Physical Activity:   . Days of Exercise per Week:   . Minutes of Exercise per Session:   Stress:   . Feeling of Stress :   Social Connections:   . Frequency of Communication with Friends and Family:   . Frequency of Social Gatherings with Friends and Family:   . Attends Religious Services:   . Active Member of Clubs or Organizations:   . Attends Banker Meetings:   Marland Kitchen Marital Status:   Intimate Partner Violence:   . Fear of Current or Ex-Partner:   . Emotionally Abused:   Marland Kitchen  Physically Abused:   . Sexually Abused:     Current Outpatient Medications on File Prior to Visit  Medication Sig Dispense Refill  . norethindrone-ethinyl estradiol (LOESTRIN) 1-20 MG-MCG tablet Take 1 tablet by mouth daily. 84 tablet 3  . valACYclovir (VALTREX) 500 MG tablet Take 1 tablet (500 mg total) by mouth 2 (two) times daily. for 3 days 30 tablet 0   No current facility-administered medications on file prior to visit.      ROS:  Review of Systems  Constitutional: Negative for fever.  Gastrointestinal: Negative for blood in stool, constipation, diarrhea, nausea and vomiting.  Genitourinary: Negative for dyspareunia, dysuria, flank pain, frequency, hematuria, urgency, vaginal bleeding, vaginal discharge and vaginal pain.  Musculoskeletal: Negative for back pain.  Skin: Negative for rash.     Objective: BP 100/60   Ht 5\' 2"  (1.575 m)   Wt 185 lb (83.9 kg)   LMP 12/02/2019 (Approximate)   BMI 33.84 kg/m    Physical Exam Constitutional:      General: She is not in acute distress. Genitourinary:     Vulva, vagina, cervix, uterus, right adnexa and left adnexa normal.     No vulval lesion,  tenderness or ulcerations noted.     No vaginal discharge, erythema, tenderness or bleeding.     Uterus is not enlarged or tender.     No right or left adnexal mass present.     Right adnexa not tender.     Left adnexa not tender.  Pulmonary:     Effort: Pulmonary effort is normal.  Musculoskeletal:        General: Normal range of motion.  Neurological:     General: No focal deficit present.     Mental Status: She is alert.     Cranial Nerves: No cranial nerve deficit.  Skin:    General: Skin is warm and dry.  Psychiatric:        Mood and Affect: Mood normal.        Behavior: Behavior normal.        Thought Content: Thought content normal.        Judgment: Judgment normal.  Vitals and nursing note reviewed.     Assessment/Plan: Screening for STD (sexually transmitted  disease) - Plan: Cervicovaginal ancillary only; Will f/u with results.   Gonorrhea - Plan: Cervicovaginal ancillary only   F/U  Return if symptoms worsen or fail to improve.  Royden Bulman B. [place] Gustafson, PA-C 12/30/2019 4:03 PM

## 2020-01-03 LAB — CERVICOVAGINAL ANCILLARY ONLY
Chlamydia: NEGATIVE
Comment: NEGATIVE
Comment: NORMAL
Neisseria Gonorrhea: NEGATIVE

## 2020-01-03 NOTE — Progress Notes (Signed)
Called pt, no answer, LVMTRC. 

## 2020-01-03 NOTE — Progress Notes (Signed)
Pls let pt know STD testing neg. Thx.

## 2020-01-04 NOTE — Progress Notes (Signed)
Pt aware.

## 2020-03-13 ENCOUNTER — Telehealth: Payer: Self-pay

## 2020-03-13 NOTE — Telephone Encounter (Signed)
Pt calling; has questions; not sure if needs to be seen.  [phone]  Pt had her period around 8/16; period stopped; a day or two later she started spotting dark blood and is now spotting dark blood again.  Pt has been late taking a pill or two.  Adv to keep calendar of her bleeding and to be sure to take pill on time every day.  If happens again to call and schedule appt.

## 2020-04-02 NOTE — Patient Instructions (Signed)
I value your feedback and entrusting us with your care. If you get a [facility] patient survey, I would appreciate you taking the time to let us know about your experience today. Thank you!  As of June 23, 2019, your lab results will be released to your MyChart immediately, before I even have a chance to see them. Please give me time to review them and contact you if there are any abnormalities. Thank you for your patience.  

## 2020-04-02 NOTE — Progress Notes (Signed)
[facility]   Chief Complaint  Patient presents with  . Urinary Tract Infection    frequency, discomfort x 2-3 weeks    HPI:      [name] is a 29 y.o. [id number] whose LMP was Patient's last menstrual period was 03/31/2020 (exact date)., presents today for urinary frequency with and without good flow, urgency, nocturia and some dysuria for a couple wks. No hematuria, LBP, pelvic pain, fevers. No hx of kidney stones. No vag sx except occas itching. Uses dove soap (scented). Drinks caffeine. No vaginal bleeding.  S/p gonorrhea 5/21 with neg TOC 6/21. Would like STD testing today.  Past Medical History:  Diagnosis Date  . Chlamydia   . Genital herpes   . H/O: C-section     Past Surgical History:  Procedure Laterality Date  . CESAREAN SECTION  2015   CCW  . INDUCED ABORTION  2013    Family History  Problem Relation Age of Onset  . Hypertension Mother   . Hypertension Father   . Cancer Maternal Uncle 28       BRAIN STAGE 4  . Hypertension Paternal Uncle   . Hypertension Maternal Grandmother   . Hypertension Paternal Grandfather   . Breast cancer Other     Social History   Socioeconomic History  . Marital status: Single    Spouse name: Not on file  . Number of children: Not on file  . Years of education: Not on file  . Highest education level: Not on file  Occupational History  . Not on file  Tobacco Use  . Smoking status: Current Every Day Smoker  . Smokeless tobacco: Never Used  Vaping Use  . Vaping Use: Never used  Substance and Sexual Activity  . Alcohol use: Not Currently  . Drug use: No  . Sexual activity: Not Currently    Birth control/protection: None  Other Topics Concern  . Not on file  Social History Narrative  . Not on file   Social Determinants of Health   Financial Resource Strain:   . Difficulty of Paying Living Expenses: Not on file  Food Insecurity:   . Worried About Programme researcher, broadcasting/film/video in the  Last Year: Not on file  . Ran Out of Food in the Last Year: Not on file  Transportation Needs:   . Lack of Transportation (Medical): Not on file  . Lack of Transportation (Non-Medical): Not on file  Physical Activity:   . Days of Exercise per Week: Not on file  . Minutes of Exercise per Session: Not on file  Stress:   . Feeling of Stress : Not on file  Social Connections:   . Frequency of Communication with Friends and Family: Not on file  . Frequency of Social Gatherings with Friends and Family: Not on file  . Attends Religious Services: Not on file  . Active Member of Clubs or Organizations: Not on file  . Attends Banker Meetings: Not on file  . Marital Status: Not on file  Intimate Partner Violence:   . Fear of Current or Ex-Partner: Not on file  . Emotionally Abused: Not on file  . Physically Abused: Not on file  . Sexually Abused: Not on file    Outpatient Medications Prior to Visit  Medication Sig Dispense Refill  . valACYclovir (VALTREX) 500 MG tablet Take 1 tablet (500 mg total) by mouth 2 (two) times daily. for 3 days 30 tablet 0  .  norethindrone-ethinyl estradiol (LOESTRIN) 1-20 MG-MCG tablet Take 1 tablet by mouth daily. (Patient not taking: Reported on 04/03/2020) 84 tablet 3   No facility-administered medications prior to visit.      ROS:  Review of Systems  Constitutional: Negative for fever.  Gastrointestinal: Negative for blood in stool, constipation, diarrhea, nausea and vomiting.  Genitourinary: Positive for frequency. Negative for dyspareunia, dysuria, flank pain, hematuria, urgency, vaginal bleeding, vaginal discharge and vaginal pain.  Musculoskeletal: Negative for back pain.  Skin: Negative for rash.  BREAST: No symptoms   OBJECTIVE:   Vitals:  BP 100/70   Ht 5\' 2"  (1.575 m)   Wt 190 lb (86.2 kg)   LMP 03/31/2020 (Exact Date)   BMI 34.75 kg/m   Physical Exam Vitals reviewed.  Constitutional:      Appearance: She is  well-developed.  Pulmonary:     Effort: Pulmonary effort is normal.  Genitourinary:    General: Normal vulva.     Pubic Area: No rash.      Labia:        Right: No rash, tenderness or lesion.        Left: No rash, tenderness or lesion.      Vagina: Normal. No vaginal discharge, erythema or tenderness.     Cervix: Normal.     Uterus: Normal. Not enlarged and not tender.      Adnexa: Right adnexa normal and left adnexa normal.       Right: No mass or tenderness.         Left: No mass or tenderness.    Musculoskeletal:        General: Normal range of motion.     Cervical back: Normal range of motion.  Skin:    General: Skin is warm and dry.  Neurological:     General: No focal deficit present.     Mental Status: She is alert and oriented to person, place, and time.  Psychiatric:        Mood and Affect: Mood normal.        Behavior: Behavior normal.        Thought Content: Thought content normal.        Judgment: Judgment normal.     Results: Results for orders placed or performed in visit on 04/03/20 (from the past 24 hour(s))  POCT Urinalysis Dipstick     Status: Abnormal   Collection Time: 04/03/20 10:42 AM  Result Value Ref Range   Color, UA yellow    Clarity, UA clear    Glucose, UA Negative Negative   Bilirubin, UA neg    Ketones, UA neg    Spec Grav, UA 1.010 1.010 - 1.025   Blood, UA small    pH, UA 5.0 5.0 - 8.0   Protein, UA Negative Negative   Urobilinogen, UA     Nitrite, UA neg    Leukocytes, UA Negative Negative   Appearance     Odor    POCT Wet Prep with KOH     Status: Normal   Collection Time: 04/03/20 10:42 AM  Result Value Ref Range   Trichomonas, UA Negative    Clue Cells Wet Prep HPF POC neg    Epithelial Wet Prep HPF POC     Yeast Wet Prep HPF POC neg    Bacteria Wet Prep HPF POC     RBC Wet Prep HPF POC     WBC Wet Prep HPF POC     KOH Prep POC Negative Negative  Assessment/Plan: UTI symptoms - Plan: nitrofurantoin,  macrocrystal-monohydrate, (MACROBID) 100 MG capsule, POCT Urinalysis Dipstick, Urine Culture; Pos sx and UA for blood (no vag bleeding), no leuks. Rx macrobid. Check C&S. F/u prn.   Vaginal irritation - Plan: POCT Wet Prep with KOH; neg exam/wet prep. Question UTI vs chem derm. Try dove sens skin soap. F/u prn.   Screening for STD (sexually transmitted disease) - Plan: Cervicovaginal ancillary only    Meds ordered this encounter  Medications  . nitrofurantoin, macrocrystal-monohydrate, (MACROBID) 100 MG capsule    Sig: Take 1 capsule (100 mg total) by mouth 2 (two) times daily for 5 days.    Dispense:  10 capsule    Refill:  0    Order Specific Question:   Supervising Provider    Answer:   Nadara Mustard [[id number]]      Return if symptoms worsen or fail to improve.  Jeovani Weisenburger B. Shakyra Mattera, PA-C 04/03/2020 10:44 AM

## 2020-04-03 ENCOUNTER — Other Ambulatory Visit (HOSPITAL_COMMUNITY)
Admission: RE | Admit: 2020-04-03 | Discharge: 2020-04-03 | Disposition: A | Discharge status: Home / Self Care | Payer: Medicaid Other | Source: Ambulatory Visit | Attending: Obstetrics and Gynecology | Admitting: Obstetrics and Gynecology

## 2020-04-03 ENCOUNTER — Ambulatory Visit (INDEPENDENT_AMBULATORY_CARE_PROVIDER_SITE_OTHER): Payer: Medicaid Other | Admitting: Obstetrics and Gynecology

## 2020-04-03 ENCOUNTER — Other Ambulatory Visit: Payer: Self-pay

## 2020-04-03 ENCOUNTER — Encounter: Payer: Self-pay | Admitting: Obstetrics and Gynecology

## 2020-04-03 VITALS — BP 100/70 | Ht 62.0 in | Wt 190.0 lb

## 2020-04-03 DIAGNOSIS — Z113 Encounter for screening for infections with a predominantly sexual mode of transmission: Secondary | ICD-10-CM | POA: Insufficient documentation

## 2020-04-03 DIAGNOSIS — N898 Other specified noninflammatory disorders of vagina: Secondary | ICD-10-CM

## 2020-04-03 DIAGNOSIS — R399 Unspecified symptoms and signs involving the genitourinary system: Secondary | ICD-10-CM | POA: Diagnosis not present

## 2020-04-03 LAB — POCT URINALYSIS DIPSTICK
Bilirubin, UA: NEGATIVE
Glucose, UA: NEGATIVE
Ketones, UA: NEGATIVE
Leukocytes, UA: NEGATIVE
Nitrite, UA: NEGATIVE
Protein, UA: NEGATIVE
Spec Grav, UA: 1.01 (ref 1.010–1.025)
pH, UA: 5 (ref 5.0–8.0)

## 2020-04-03 LAB — POCT WET PREP WITH KOH
Clue Cells Wet Prep HPF POC: NEGATIVE
KOH Prep POC: NEGATIVE
Trichomonas, UA: NEGATIVE
Yeast Wet Prep HPF POC: NEGATIVE

## 2020-04-03 MED ORDER — NITROFURANTOIN MONOHYD MACRO 100 MG PO CAPS: 100.0000 mg | ORAL_CAPSULE | Freq: Two times a day (BID) | ORAL | 0 refills | Status: AC

## 2020-04-04 LAB — CERVICOVAGINAL ANCILLARY ONLY
Chlamydia: NEGATIVE
Comment: NEGATIVE
Comment: NORMAL
Neisseria Gonorrhea: NEGATIVE

## 2020-04-05 LAB — URINE CULTURE: Organism ID, Bacteria: NO GROWTH

## 2020-04-05 NOTE — Progress Notes (Signed)
Pls let pt know C&S neg for UTI, so can stop abx. Most likely related to caffeine use, so decrease caffeine to see if sx improve. STD testing also neg. F/u prn. Thx.

## 2020-04-05 NOTE — Progress Notes (Signed)
Called pt, no answer, LVMTRC. 

## 2020-04-05 NOTE — Progress Notes (Signed)
Pt aware.

## 2020-09-18 ENCOUNTER — Other Ambulatory Visit (HOSPITAL_COMMUNITY)
Admission: RE | Admit: 2020-09-18 | Discharge: 2020-09-18 | Disposition: A | Discharge status: Home / Self Care | Payer: Medicaid Other | Source: Ambulatory Visit | Attending: Obstetrics | Admitting: Obstetrics

## 2020-09-18 ENCOUNTER — Ambulatory Visit (INDEPENDENT_AMBULATORY_CARE_PROVIDER_SITE_OTHER): Payer: Medicaid Other | Admitting: Obstetrics

## 2020-09-18 ENCOUNTER — Other Ambulatory Visit: Payer: Self-pay

## 2020-09-18 VITALS — BP 118/74 | Ht 62.0 in | Wt 192.0 lb

## 2020-09-18 DIAGNOSIS — Z113 Encounter for screening for infections with a predominantly sexual mode of transmission: Secondary | ICD-10-CM

## 2020-09-18 DIAGNOSIS — R399 Unspecified symptoms and signs involving the genitourinary system: Secondary | ICD-10-CM

## 2020-09-18 DIAGNOSIS — R319 Hematuria, unspecified: Secondary | ICD-10-CM | POA: Diagnosis not present

## 2020-09-18 MED ORDER — PHENAZOPYRIDINE HCL 200 MG PO TABS: 200.0000 mg | ORAL_TABLET | Freq: Three times a day (TID) | ORAL | 1 refills | Status: DC | PRN

## 2020-09-18 NOTE — Progress Notes (Signed)
Obstetrics & Gynecology Office Visit   Chief Complaint:  Chief Complaint  Patient presents with  . Exposure to STD    History of Present Illness: Jane Bowman present s for STI screening, and with a c/o dysuria x 2 days. She shares a recent sexual encounter after which her sxs developed. She denies any flank pain,  Fever or vaginal discharge.  She has been seen at our office with a similar complaint, and no UTI was diagnosed.  Her mother has a hx of renal calculi. Jane Bowman noticed the dysuria yesterday. She is requesting STI screening, bot cultures and blood work. Her hx is significant for Chlamydia and HSV. She contracepts wth OCPs.   Review of Systems:  Review of Systems  Constitutional: Negative.   Genitourinary: Positive for dysuria and hematuria.  Skin: Negative.   All other systems reviewed and are negative.    Past Medical History:  Past Medical History:  Diagnosis Date  . Chlamydia   . Genital herpes   . H/O: C-section     Past Surgical History:  Past Surgical History:  Procedure Laterality Date  . CESAREAN SECTION  2015   CCW  . INDUCED ABORTION  2013    Gynecologic History: Patient's last menstrual period was 08/28/2020.  Obstetric History: [id number]  Family History:  Family History  Problem Relation Age of Onset  . Hypertension Mother   . Hypertension Father   . Cancer Maternal Uncle 28       BRAIN STAGE 4  . Hypertension Paternal Uncle   . Hypertension Maternal Grandmother   . Hypertension Paternal Grandfather   . Breast cancer Other     Social History:  Social History   Socioeconomic History  . Marital status: Single    Spouse name: Not on file  . Number of children: Not on file  . Years of education: Not on file  . Highest education level: Not on file  Occupational History  . Not on file  Tobacco Use  . Smoking status: Current Every Day Smoker  . Smokeless tobacco: Never Used  Vaping Use  . Vaping Use: Never used  Substance and Sexual  Activity  . Alcohol use: Not Currently  . Drug use: No  . Sexual activity: Not Currently    Birth control/protection: None  Other Topics Concern  . Not on file  Social History Narrative  . Not on file   Social Determinants of Health   Financial Resource Strain: Not on file  Food Insecurity: Not on file  Transportation Needs: Not on file  Physical Activity: Not on file  Stress: Not on file  Social Connections: Not on file  Intimate Partner Violence: Not on file    Allergies:  Allergies  Allergen Reactions  . Amoxicillin Rash    Medications: Prior to Admission medications   Medication Sig Start Date End Date Taking? Authorizing Provider  norethindrone-ethinyl estradiol (LOESTRIN) 1-20 MG-MCG tablet Take 1 tablet by mouth daily. 12/09/19  Yes Copland, Ilona Sorrel, PA-C  phenazopyridine (PYRIDIUM) 200 MG tablet Take 1 tablet (200 mg total) by mouth 3 (three) times daily as needed for pain (urethral spasm). 09/18/20  Yes Mirna Mires, CNM  valACYclovir (VALTREX) 500 MG tablet Take 1 tablet (500 mg total) by mouth 2 (two) times daily. for 3 days 11/29/19   CoplandIlona Sorrel, PA-C    Physical Exam Vitals:  Vitals:   09/18/20 0805  BP: 118/74   Patient's last menstrual period was 08/28/2020.  Physical Exam Constitutional:  Appearance: Normal appearance.  HENT:     Nose: Nose normal.  Cardiovascular:     Rate and Rhythm: Normal rate and regular rhythm.     Pulses: Normal pulses.     Heart sounds: Normal heart sounds.  Pulmonary:     Effort: Pulmonary effort is normal.     Breath sounds: Normal breath sounds.  Abdominal:     Palpations: Abdomen is soft.  Genitourinary:    Comments: Normal female genitalia. No vaginal discharge. No external rashes or lesions noted. No irritation. Normal appearing cervix and no CMT. Cultures retrieved. Musculoskeletal:        General: Normal range of motion.     Cervical back: Normal range of motion and neck supple.  Skin:     General: Skin is warm and dry.  Neurological:     General: No focal deficit present.     Mental Status: She is alert and oriented to person, place, and time.  Psychiatric:        Mood and Affect: Mood normal.        Behavior: Behavior normal.    Urine dip shows hematuria, no leuks, no nitrites or bacteria.  Assessment: 30 y.o. [id number]  With dysuria Hematuria STI screening visit   Plan: Problem List Items Addressed This Visit   None   Visit Diagnoses    Screen for STD (sexually transmitted disease)    -  Primary   Relevant Orders   HEP, RPR, HIV Panel   Cervicovaginal ancillary only   UTI symptoms       Relevant Medications   phenazopyridine (PYRIDIUM) 200 MG tablet     Rx for Pyridium sent. Discussed her risk for STIs. Condom use advised and limiting number of sexual partners. Will culture urine and contact her with results. Option of referral to Uro discussed for ongoing hematuria.  Mirna Mires, CNM  09/18/2020 10:25 AM

## 2020-09-19 ENCOUNTER — Encounter: Payer: Self-pay | Admitting: Obstetrics

## 2020-09-19 DIAGNOSIS — N760 Acute vaginitis: Secondary | ICD-10-CM | POA: Insufficient documentation

## 2020-09-19 DIAGNOSIS — B9689 Other specified bacterial agents as the cause of diseases classified elsewhere: Secondary | ICD-10-CM | POA: Insufficient documentation

## 2020-09-19 LAB — CERVICOVAGINAL ANCILLARY ONLY
Bacterial Vaginitis (gardnerella): POSITIVE — AB
Candida Glabrata: NEGATIVE
Candida Vaginitis: NEGATIVE
Chlamydia: NEGATIVE
Comment: NEGATIVE
Comment: NEGATIVE
Comment: NEGATIVE
Comment: NEGATIVE
Comment: NEGATIVE
Comment: NORMAL
Neisseria Gonorrhea: NEGATIVE
Trichomonas: NEGATIVE

## 2020-09-19 LAB — HEP, RPR, HIV PANEL
HIV Screen 4th Generation wRfx: NONREACTIVE
Hepatitis B Surface Ag: NEGATIVE
RPR Ser Ql: NONREACTIVE

## 2020-09-20 ENCOUNTER — Other Ambulatory Visit: Payer: Self-pay | Admitting: Obstetrics

## 2020-09-20 DIAGNOSIS — R109 Unspecified abdominal pain: Secondary | ICD-10-CM

## 2020-09-20 DIAGNOSIS — G8929 Other chronic pain: Secondary | ICD-10-CM

## 2020-09-20 DIAGNOSIS — N760 Acute vaginitis: Secondary | ICD-10-CM

## 2020-09-20 DIAGNOSIS — R399 Unspecified symptoms and signs involving the genitourinary system: Secondary | ICD-10-CM

## 2020-09-20 DIAGNOSIS — B9689 Other specified bacterial agents as the cause of diseases classified elsewhere: Secondary | ICD-10-CM

## 2020-09-20 MED ORDER — METRONIDAZOLE 500 MG PO TABS: 500.0000 mg | ORAL_TABLET | Freq: Two times a day (BID) | ORAL | 0 refills | Status: AC

## 2020-09-21 LAB — URINE CULTURE

## 2020-09-26 ENCOUNTER — Other Ambulatory Visit: Payer: Self-pay | Admitting: Obstetrics

## 2020-09-26 DIAGNOSIS — R8271 Bacteriuria: Secondary | ICD-10-CM

## 2020-09-26 MED ORDER — NITROFURANTOIN MONOHYD MACRO 100 MG PO CAPS: 100.0000 mg | ORAL_CAPSULE | Freq: Two times a day (BID) | ORAL | 0 refills | Status: AC

## 2020-09-26 NOTE — Progress Notes (Signed)
Rx for Macrobid sent to pharmacy. Patient was contacted by phone.

## 2020-10-03 ENCOUNTER — Other Ambulatory Visit: Payer: Self-pay

## 2020-10-03 ENCOUNTER — Ambulatory Visit
Admission: RE | Admit: 2020-10-03 | Discharge: 2020-10-03 | Disposition: A | Discharge status: Home / Self Care | Payer: Medicaid Other | Source: Ambulatory Visit | Attending: Obstetrics | Admitting: Obstetrics

## 2020-10-03 DIAGNOSIS — G8929 Other chronic pain: Secondary | ICD-10-CM | POA: Diagnosis present

## 2020-10-03 DIAGNOSIS — R399 Unspecified symptoms and signs involving the genitourinary system: Secondary | ICD-10-CM | POA: Diagnosis present

## 2020-10-03 DIAGNOSIS — R109 Unspecified abdominal pain: Secondary | ICD-10-CM | POA: Diagnosis present

## 2020-12-20 ENCOUNTER — Other Ambulatory Visit: Payer: Self-pay | Admitting: Obstetrics and Gynecology

## 2020-12-20 DIAGNOSIS — Z3041 Encounter for surveillance of contraceptive pills: Secondary | ICD-10-CM

## 2020-12-21 ENCOUNTER — Telehealth: Payer: Self-pay

## 2020-12-21 DIAGNOSIS — Z3041 Encounter for surveillance of contraceptive pills: Secondary | ICD-10-CM

## 2020-12-21 MED ORDER — NORETHINDRONE ACET-ETHINYL EST 1-20 MG-MCG PO TABS: 1.0000 | ORAL_TABLET | Freq: Every day | ORAL | 0 refills | Status: DC

## 2020-12-21 NOTE — Telephone Encounter (Signed)
Pt calling for refill of her birth control; no mychart; needs by Sunday.  [phone]  mailbox is full.

## 2020-12-24 MED ORDER — NORETHINDRONE ACET-ETHINYL EST 1-20 MG-MCG PO TABS: 1.0000 | ORAL_TABLET | Freq: Every day | ORAL | 0 refills | Status: DC

## 2020-12-24 NOTE — Telephone Encounter (Signed)
Refills eRx'd.  Pt's mailbox is full.

## 2021-02-04 NOTE — Progress Notes (Signed)
PCP:  [facility]   Chief Complaint  Patient presents with   Gynecologic Exam    Right side lower back pain x 2 weeks     HPI:      [name] is a 30 y.o. No obstetric history on file. who LMP was Patient's last menstrual period was 01/18/2021 (approximate)., presents today for her annual examination.  Her menses are regular every 28-30 days,  lasting 4 days.  Dysmenorrhea none. She does not have intermenstrual bleeding.   Sex activity: single partner, contraception - OCP (estrogen/progesterone).  Last Pap: 09/28/18  Results were: no abnormalities  Hx of STDs: chlamydia, HSV--takes valtrex prn sx, doesn't need RF currently. Neg STD testing 3/22 but would like done again today. No known exposures.  Pt had BV 2/21, treated with flagyl. Sx resolved. No recent sx, but has hx of BV in past.   There is a FH of breast cancer in her mat grt aunt, genetic testing not indicated. There is no FH of ovarian cancer. The patient does not do self-breast exams.  Tobacco use: The patient currently smokes 1/2 packs of cigarettes per day for the past many years.  Trying to cut back. Alcohol use: none No drug use.  Exercise: min active  She does not get adequate calcium or Vitamin D in her diet.  Has had pain in RT mid to low back for the past 2 wks. No trauma, lifting, injury. Feels punched in back but also has occas sharp pains. Hasn't tried heat/ice/NSAIDs. No urin sx although had UTI on C&S 3/22 and treated with macrobid. No prior hx of kidney stones but mom has them.   Past Medical History:  Diagnosis Date   Chlamydia    Genital herpes    H/O: C-section     Past Surgical History:  Procedure Laterality Date   CESAREAN SECTION  2015   CCW   INDUCED ABORTION  2013    Family History  Problem Relation Age of Onset   Hypertension Mother    Hypertension Father    Cancer Maternal Uncle 75       BRAIN STAGE 4   Hypertension Paternal Uncle     Hypertension Maternal Grandmother    Hypertension Paternal Grandfather    Breast cancer Other     Social History   Socioeconomic History   Marital status: Single    Spouse name: Not on file   Number of children: Not on file   Years of education: Not on file   Highest education level: Not on file  Occupational History   Not on file  Tobacco Use   Smoking status: Every Day   Smokeless tobacco: Never  Vaping Use   Vaping Use: Never used  Substance and Sexual Activity   Alcohol use: Not Currently   Drug use: No   Sexual activity: Yes    Birth control/protection: Pill  Other Topics Concern   Not on file  Social History Narrative   Not on file   Social Determinants of Health   Financial Resource Strain: Not on file  Food Insecurity: Not on file  Transportation Needs: Not on file  Physical Activity: Not on file  Stress: Not on file  Social Connections: Not on file  Intimate Partner Violence: Not on file    Current Meds  Medication Sig   [DISCONTINUED] norethindrone-ethinyl estradiol (LOESTRIN) 1-20 MG-MCG tablet Take 1 tablet by mouth daily.     ROS:  Review of  Systems  Constitutional:  Negative for fatigue, fever and unexpected weight change.  Respiratory:  Negative for cough, shortness of breath and wheezing.   Cardiovascular:  Negative for chest pain, palpitations and leg swelling.  Gastrointestinal:  Negative for blood in stool, constipation, diarrhea, nausea and vomiting.  Endocrine: Negative for cold intolerance, heat intolerance and polyuria.  Genitourinary:  Negative for dyspareunia, dysuria, flank pain, frequency, genital sores, hematuria, menstrual problem, pelvic pain, urgency, vaginal bleeding, vaginal discharge and vaginal pain.  Musculoskeletal:  Positive for back pain. Negative for joint swelling and myalgias.  Skin:  Negative for rash.  Neurological:  Negative for dizziness, syncope, light-headedness, numbness and headaches.  Hematological:   Negative for adenopathy.  Psychiatric/Behavioral:  Negative for agitation, confusion, sleep disturbance and suicidal ideas. The patient is not nervous/anxious.     Objective: BP 110/70   Ht 5\' 2"  (1.575 m)   Wt 198 lb (89.8 kg)   LMP 01/18/2021 (Approximate)   BMI 36.21 kg/m    Physical Exam Constitutional:      Appearance: She is well-developed.  Genitourinary:     Vulva normal.     Right Labia: No rash, tenderness or lesions.    Left Labia: No tenderness, lesions or rash.    No vaginal discharge, erythema or tenderness.      Right Adnexa: not tender and no mass present.    Left Adnexa: not tender and no mass present.    No cervical friability or polyp.     Uterus is not enlarged or tender.  Breasts:    Right: No mass, nipple discharge, skin change or tenderness.     Left: No mass, nipple discharge, skin change or tenderness.  Neck:     Thyroid: No thyromegaly.  Cardiovascular:     Rate and Rhythm: Normal rate and regular rhythm.     Heart sounds: Normal heart sounds. No murmur heard. Pulmonary:     Effort: Pulmonary effort is normal.     Breath sounds: Normal breath sounds.  Abdominal:     Palpations: Abdomen is soft.     Tenderness: There is no abdominal tenderness. There is no guarding or rebound.  Musculoskeletal:        General: Normal range of motion.       Arms:     Cervical back: Normal range of motion.  Lymphadenopathy:     Cervical: No cervical adenopathy.  Neurological:     General: No focal deficit present.     Mental Status: She is alert and oriented to person, place, and time.     Cranial Nerves: No cranial nerve deficit.  Skin:    General: Skin is warm and dry.  Psychiatric:        Mood and Affect: Mood normal.        Behavior: Behavior normal.        Thought Content: Thought content normal.        Judgment: Judgment normal.  Vitals reviewed.   Results for orders placed or performed in visit on 02/05/21 (from the past 24 hour(s))  POCT  Urinalysis Dipstick     Status: Abnormal   Collection Time: 02/05/21  9:48 AM  Result Value Ref Range   Color, UA yellow    Clarity, UA clear    Glucose, UA Negative Negative   Bilirubin, UA neg    Ketones, UA neg    Spec Grav, UA 1.025 1.010 - 1.025   Blood, UA mod    pH, UA 5.0 5.0 -  8.0   Protein, UA Negative Negative   Urobilinogen, UA     Nitrite, UA neg    Leukocytes, UA Negative Negative   Appearance     Odor      Assessment/Plan:  Encounter for annual routine gynecological examination  Cervical cancer screening - Plan: Cytology - PAP  Screening for STD (sexually transmitted disease) - Plan: Cytology - PAP  Encounter for surveillance of contraceptive pills - Plan: norethindrone-ethinyl estradiol (LOESTRIN) 1-20 MG-MCG tablet; OCP RF  Kidney pain - Plan: Urine Culture, POCT Urinalysis Dipstick, Urinalysis, Routine w reflex microscopic; pos blood on UA. Question kidney stones. Check C&S and LC UA. If neg and pt still sx, will eval further. Doesn't seem MSK. Increase water/can try heating pad/NSAIDs.   Asymptomatic microscopic hematuria - Plan: Urine Culture, POCT Urinalysis Dipstick, Urinalysis, Routine w reflex microscopic   Meds ordered this encounter  Medications   norethindrone-ethinyl estradiol (LOESTRIN) 1-20 MG-MCG tablet    Sig: Take 1 tablet by mouth daily.    Dispense:  84 tablet    Refill:  3    Order Specific Question:   Supervising Provider    Answer:   Nadara Mustard [[id number]]              GYN counsel adequate intake of calcium and vitamin D, diet and exercise, tobacco cessation.     F/U  Return in about 1 year (around 02/05/2022).  Andre Gallego B. Melenie Minniear, PA-C 02/05/2021 9:49 AM

## 2021-02-05 ENCOUNTER — Ambulatory Visit (INDEPENDENT_AMBULATORY_CARE_PROVIDER_SITE_OTHER): Payer: Medicaid Other | Admitting: Obstetrics and Gynecology

## 2021-02-05 ENCOUNTER — Encounter: Payer: Self-pay | Admitting: Obstetrics and Gynecology

## 2021-02-05 ENCOUNTER — Other Ambulatory Visit (HOSPITAL_COMMUNITY)
Admission: RE | Admit: 2021-02-05 | Discharge: 2021-02-05 | Disposition: A | Discharge status: Home / Self Care | Payer: Medicaid Other | Source: Ambulatory Visit | Attending: Obstetrics and Gynecology | Admitting: Obstetrics and Gynecology

## 2021-02-05 ENCOUNTER — Other Ambulatory Visit: Payer: Self-pay

## 2021-02-05 VITALS — BP 110/70 | Ht 62.0 in | Wt 198.0 lb

## 2021-02-05 DIAGNOSIS — A6004 Herpesviral vulvovaginitis: Secondary | ICD-10-CM

## 2021-02-05 DIAGNOSIS — Z01419 Encounter for gynecological examination (general) (routine) without abnormal findings: Secondary | ICD-10-CM | POA: Diagnosis not present

## 2021-02-05 DIAGNOSIS — R3121 Asymptomatic microscopic hematuria: Secondary | ICD-10-CM | POA: Diagnosis not present

## 2021-02-05 DIAGNOSIS — N23 Unspecified renal colic: Secondary | ICD-10-CM | POA: Diagnosis not present

## 2021-02-05 DIAGNOSIS — Z113 Encounter for screening for infections with a predominantly sexual mode of transmission: Secondary | ICD-10-CM | POA: Diagnosis not present

## 2021-02-05 DIAGNOSIS — Z124 Encounter for screening for malignant neoplasm of cervix: Secondary | ICD-10-CM | POA: Insufficient documentation

## 2021-02-05 DIAGNOSIS — Z3041 Encounter for surveillance of contraceptive pills: Secondary | ICD-10-CM

## 2021-02-05 LAB — POCT URINALYSIS DIPSTICK
Bilirubin, UA: NEGATIVE
Glucose, UA: NEGATIVE
Ketones, UA: NEGATIVE
Leukocytes, UA: NEGATIVE
Nitrite, UA: NEGATIVE
Protein, UA: NEGATIVE
Spec Grav, UA: 1.025 (ref 1.010–1.025)
pH, UA: 5 (ref 5.0–8.0)

## 2021-02-05 MED ORDER — NORETHINDRONE ACET-ETHINYL EST 1-20 MG-MCG PO TABS: 1.0000 | ORAL_TABLET | Freq: Every day | ORAL | 3 refills | Status: DC

## 2021-02-05 NOTE — Patient Instructions (Signed)
I value your feedback and you entrusting us with your care. If you get a [place] patient survey, I would appreciate you taking the time to let us know about your experience today. Thank you! ? ? ?

## 2021-02-06 LAB — CYTOLOGY - PAP
Adequacy: ABSENT
Chlamydia: NEGATIVE
Comment: NEGATIVE
Comment: NORMAL
Diagnosis: NEGATIVE
Neisseria Gonorrhea: NEGATIVE

## 2021-02-06 LAB — URINALYSIS, ROUTINE W REFLEX MICROSCOPIC
Bilirubin, UA: NEGATIVE
Glucose, UA: NEGATIVE
Ketones, UA: NEGATIVE
Leukocytes,UA: NEGATIVE
Nitrite, UA: NEGATIVE
Protein,UA: NEGATIVE
Specific Gravity, UA: 1.021 (ref 1.005–1.030)
Urobilinogen, Ur: 0.2 mg/dL (ref 0.2–1.0)
pH, UA: 6 (ref 5.0–7.5)

## 2021-02-06 LAB — MICROSCOPIC EXAMINATION
Epithelial Cells (non renal): >10 /hpf — AB (ref 0–10)
WBC, UA: NONE SEEN /hpf (ref 0–5)

## 2021-02-07 LAB — URINE CULTURE

## 2021-02-07 NOTE — Progress Notes (Signed)
Called pt, no answer, LVMTRC. 

## 2021-02-07 NOTE — Progress Notes (Signed)
Doesn't sound like kidney stone pain, which is sharp and colicky. Try heat/NSAIDs like we discussed. If persists, f/u with PCP. Thx

## 2021-02-07 NOTE — Progress Notes (Signed)
Pls let pt know the urine culture was negative for a UTI. Is she still having back pain? If so, will order imaging for kidney stones. Pap/STD testing also negative. Thx.

## 2021-03-13 ENCOUNTER — Emergency Department
Admission: EM | Admit: 2021-03-13 | Discharge: 2021-03-13 | Disposition: A | Discharge status: Home / Self Care | Payer: Medicaid Other | Attending: Emergency Medicine | Admitting: Emergency Medicine

## 2021-03-13 ENCOUNTER — Other Ambulatory Visit: Payer: Self-pay

## 2021-03-13 ENCOUNTER — Emergency Department: Payer: Medicaid Other

## 2021-03-13 ENCOUNTER — Encounter: Payer: Self-pay | Admitting: Radiology

## 2021-03-13 DIAGNOSIS — R0789 Other chest pain: Secondary | ICD-10-CM | POA: Diagnosis present

## 2021-03-13 DIAGNOSIS — F419 Anxiety disorder, unspecified: Secondary | ICD-10-CM | POA: Diagnosis not present

## 2021-03-13 DIAGNOSIS — F17200 Nicotine dependence, unspecified, uncomplicated: Secondary | ICD-10-CM | POA: Insufficient documentation

## 2021-03-13 LAB — CBC
HCT: 39.3 % (ref 36.0–46.0)
Hemoglobin: 13.8 g/dL (ref 12.0–15.0)
MCH: 33.7 pg (ref 26.0–34.0)
MCHC: 35.1 g/dL (ref 30.0–36.0)
MCV: 95.9 fL (ref 80.0–100.0)
Platelets: 262 10*3/uL (ref 150–400)
RBC: 4.1 MIL/uL (ref 3.87–5.11)
RDW: 11.6 % (ref 11.5–15.5)
WBC: 7.1 10*3/uL (ref 4.0–10.5)
nRBC: 0 % (ref 0.0–0.2)

## 2021-03-13 LAB — BASIC METABOLIC PANEL
Anion gap: 7 (ref 5–15)
BUN: 9 mg/dL (ref 6–20)
CO2: 26 mmol/L (ref 22–32)
Calcium: 9 mg/dL (ref 8.9–10.3)
Chloride: 103 mmol/L (ref 98–111)
Creatinine, Ser: 0.67 mg/dL (ref 0.44–1.00)
GFR, Estimated: >60 mL/min (ref >60)
Glucose, Bld: 119 mg/dL — ABNORMAL HIGH (ref 70–99)
Potassium: 3.7 mmol/L (ref 3.5–5.1)
Sodium: 136 mmol/L (ref 135–145)

## 2021-03-13 LAB — POC URINE PREG, ED: Preg Test, Ur: NEGATIVE

## 2021-03-13 LAB — TROPONIN I (HIGH SENSITIVITY): Troponin I (High Sensitivity): 3 ng/L (ref <18)

## 2021-03-13 MED ORDER — IOHEXOL 350 MG/ML SOLN
75.0000 mL | Freq: Once | INTRAVENOUS | Status: AC | PRN
  Administered 2021-03-13: 75 mL via INTRAVENOUS
  Filled 2021-03-13: qty 75

## 2021-03-13 MED ORDER — LORAZEPAM 2 MG/ML IJ SOLN: 0.5000 mg | Freq: Once | INTRAMUSCULAR | Status: DC

## 2021-03-13 NOTE — ED Triage Notes (Signed)
Pt here with CP and SOB that started a while ago. Pt states that she will be at rest and then her chest will start to become tight and she would be SOB. Pt has been seen for this in the past to which it was suspected to be her birth control but pt states that the pain has gotten worse lately.

## 2021-03-13 NOTE — ED Provider Notes (Signed)
[facility] Emergency Department Provider Note  ____________________________________________   Event Date/Time   First MD Initiated Contact with Patient 03/13/21 1235     (approximate)  I have reviewed the triage vital signs and the nursing notes.   HISTORY  Chief Complaint Chest Pain    HPI JAIMIE Bowman is a 30 y.o. female presents emergency department with intermittent chest pain.  Some shortness of breath.  Patient states when she is walking she is very short of breath.  States sharp chest pains that started last night.  Patient is on birth control pills and is a smoker.  No fever, chills, cough.  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 Authorizing Provider  norethindrone-ethinyl estradiol (LOESTRIN) 1-20 MG-MCG tablet Take 1 tablet by mouth daily. 02/05/21   Copland, Ilona Sorrel, PA-C  phenazopyridine (PYRIDIUM) 200 MG tablet Take 1 tablet (200 mg total) by mouth 3 (three) times daily as needed for pain (urethral spasm). Patient not taking: Reported on 02/05/2021 09/18/20   Mirna Mires, CNM  valACYclovir (VALTREX) 500 MG tablet Take 1 tablet (500 mg total) by mouth 2 (two) times daily. for 3 days Patient not taking: Reported on 02/05/2021 11/29/19   Copland, Ilona Sorrel, PA-C    Allergies Amoxicillin  Family History  Problem Relation Age of Onset   Hypertension Mother    Hypertension Father    Cancer Maternal Uncle 6       BRAIN STAGE 4   Hypertension Paternal Uncle    Hypertension Maternal Grandmother    Hypertension Paternal Grandfather     Breast cancer Other     Social History Social History   Tobacco Use   Smoking status: Every Day   Smokeless tobacco: Never  Vaping Use   Vaping Use: Never used  Substance Use Topics   Alcohol use: Not Currently   Drug use: No    Review of Systems  Constitutional: No fever/chills Eyes: No visual changes. ENT: No sore throat. Respiratory: Denies cough Cardiovascular: Positive chest pain Gastrointestinal: Denies abdominal pain Genitourinary: Negative for dysuria. Musculoskeletal: Negative for back pain. Skin: Negative for rash. Psychiatric: no mood changes,     ____________________________________________   PHYSICAL EXAM:  VITAL SIGNS: ED Triage Vitals [03/13/21 1130]  Enc Vitals Group     BP 116/77     Pulse Rate 66     Resp 18     Temp 98 F (36.7 C)     Temp Source Oral     SpO2 98 %     Weight 190 lb (86.2 kg)     Height 5\' 2"  (1.575 m)     Head Circumference      Peak Flow      Pain Score 8     Pain Loc      Pain Edu?      Excl. in GC?     Constitutional: Alert and oriented. Well appearing and in no acute distress. Eyes: Conjunctivae are normal.  Head: Atraumatic. Nose: No congestion/rhinnorhea. Mouth/Throat:  Mucous membranes are moist.   Neck:  supple no lymphadenopathy noted Cardiovascular: Normal rate, regular rhythm. Heart sounds are normal Respiratory: Normal respiratory effort.  No retractions, lungs c t a  Abd: soft nontender bs normal all 4 quad GU: deferred Musculoskeletal: FROM all extremities, warm and well perfused Neurologic:  Normal speech and language.  Skin:  Skin is warm, dry and intact. No rash noted. Psychiatric: Mood and affect are normal. Speech and behavior are normal.  ____________________________________________   LABS (all labs ordered are listed, but only abnormal results are displayed)  Labs Reviewed  BASIC METABOLIC PANEL - Abnormal; Notable for the following components:      Result Value   Glucose, Bld  119 (*)    All other components within normal limits  CBC  POC URINE PREG, ED  TROPONIN I (HIGH SENSITIVITY)   ____________________________________________   ____________________________________________  RADIOLOGY  Chest x-ray CTA chest for PE  ____________________________________________   PROCEDURES  Procedure(s) performed: No  Procedures    ____________________________________________   INITIAL IMPRESSION / ASSESSMENT AND PLAN / ED COURSE  Pertinent labs & imaging results that were available during my care of the patient were reviewed by me and considered in my medical decision making (see chart for details).   Patient is a 30 year old female presents emergency department with chest pain and shortness of breath.  See HPI.  Physical exam shows patient to appear stable.  DDx: Anxiety, chest wall pain, PE, MI  Chest x-ray reviewed by me confirmed by radiology be negative for any acute abnormalities  Labs are reassuring, CBC, metabolic panel, troponin are all normal, POC pregnancy is negative  EKg shows NSR see physician read  Due to the patient's shortness of breath on exertion along with using birth control pills and smoking feel we should do a CT for PE.  Patient is deferring the D-dimer at this time, just like to go ahead with a CTA.   Ct chest for pe is negative for pe, reviewed by me confirmed by radiology  Explained the findings to the patient.  She is to f/u with a psychologist for anxiety, aggrees with treatment plan, discharged in stable condition  Jane Bowman was evaluated in Emergency Department on 03/13/2021 for the symptoms described in the history of present illness. She was evaluated in the context of the global COVID-19 pandemic, which necessitated consideration that the patient might be at risk for infection with the SARS-CoV-2 virus that causes COVID-19. Institutional protocols and algorithms that pertain to the evaluation of patients at risk  for COVID-19 are in a state of rapid change based on information released by regulatory bodies including the CDC and federal and state organizations. These policies and algorithms were followed during the patient's care in the ED.    As part of my medical decision making, I reviewed the following data within the electronic MEDICAL RECORD NUMBER Nursing notes reviewed and incorporated, Labs reviewed , EKG interpreted NSR, Old chart reviewed, Radiograph reviewed , Notes from prior ED visits, and [place] Controlled Substance Database  ____________________________________________   FINAL CLINICAL IMPRESSION(S) / ED DIAGNOSES  Final diagnoses:  Chest wall pain  Anxiety      NEW MEDICATIONS STARTED DURING THIS VISIT:  Discharge Medication List as of 03/13/2021  3:52 PM       Note:  This document was prepared using Dragon voice recognition software and may include unintentional dictation errors.    Faythe Ghee, PA-C 03/13/21 1631    Sharyn Creamer,  MD 03/13/21 2116

## 2021-05-14 ENCOUNTER — Other Ambulatory Visit: Payer: Self-pay | Admitting: Obstetrics and Gynecology

## 2021-05-14 DIAGNOSIS — A6004 Herpesviral vulvovaginitis: Secondary | ICD-10-CM

## 2021-05-24 ENCOUNTER — Other Ambulatory Visit (HOSPITAL_COMMUNITY)
Admission: RE | Admit: 2021-05-24 | Discharge: 2021-05-24 | Disposition: A | Discharge status: Home / Self Care | Payer: Medicaid Other | Source: Ambulatory Visit | Attending: Advanced Practice Midwife | Admitting: Advanced Practice Midwife

## 2021-05-24 ENCOUNTER — Ambulatory Visit: Payer: Medicaid Other | Admitting: Advanced Practice Midwife

## 2021-05-24 ENCOUNTER — Encounter: Payer: Self-pay | Admitting: Advanced Practice Midwife

## 2021-05-24 ENCOUNTER — Other Ambulatory Visit: Payer: Self-pay

## 2021-05-24 VITALS — BP 110/68 | Ht 62.0 in | Wt 197.0 lb

## 2021-05-24 DIAGNOSIS — N898 Other specified noninflammatory disorders of vagina: Secondary | ICD-10-CM

## 2021-05-27 ENCOUNTER — Encounter: Payer: Self-pay | Admitting: Advanced Practice Midwife

## 2021-05-27 NOTE — Progress Notes (Signed)
Patient ID: Jane Bowman, female   DOB: Oct 12, 1990, 30 y.o.   MRN: [id number]  Reason for Consult: Vaginal Discharge (Just don't feel right down there per patient x 1 wk 1/2, ? BV. RM 4)   Subjective:  Date of Service: 05/24/2021  HPI:  Jane Bowman is a 30 y.o. female being seen for symptoms of vaginitis. She reports her symptoms began about a week and a half ago after her period. She has vaginal odor and general groin area discomfort. She does have a history of BV. She denies itching, irritation or discharge. She requests vaginitis and STD testing. She has no other concerns today.   Past Medical History:  Diagnosis Date   Chlamydia    Genital herpes    H/O: C-section    Family History  Problem Relation Age of Onset   Hypertension Mother    Hypertension Father    Cancer Maternal Uncle 77       BRAIN STAGE 4   Hypertension Paternal Uncle    Hypertension Maternal Grandmother    Hypertension Paternal Grandfather    Breast cancer Other    Past Surgical History:  Procedure Laterality Date   CESAREAN SECTION  2015   [facility]W   INDUCED ABORTION  2013    Short Social History:  Social History   Tobacco Use   Smoking status: Every Day   Smokeless tobacco: Never  Substance Use Topics   Alcohol use: Not Currently    Allergies  Allergen Reactions   Amoxicillin Rash    Current Outpatient Medications  Medication Sig Dispense Refill   norethindrone-ethinyl estradiol (LOESTRIN) 1-20 MG-MCG tablet Take 1 tablet by mouth daily. (Patient not taking: Reported on 05/24/2021) 84 tablet 3   phenazopyridine (PYRIDIUM) 200 MG tablet Take 1 tablet (200 mg total) by mouth 3 (three) times daily as needed for pain (urethral spasm). (Patient not taking: Reported on 02/05/2021) 10 tablet 1   valACYclovir (VALTREX) 500 MG tablet TAKE 1 TABLET(500 MG) BY MOUTH TWICE DAILY FOR 3 DAYS 30 tablet 0   No current facility-administered medications for this visit.   Review of Systems   Constitutional:  Negative for chills and fever.  HENT:  Negative for congestion, ear discharge, ear pain, hearing loss, sinus pain and sore throat.   Eyes:  Negative for blurred vision and double vision.  Respiratory:  Negative for cough, shortness of breath and wheezing.   Cardiovascular:  Negative for chest pain, palpitations and leg swelling.  Gastrointestinal:  Negative for abdominal pain, blood in stool, constipation, diarrhea, heartburn, melena, nausea and vomiting.  Genitourinary:  Negative for dysuria, flank pain, frequency, hematuria and urgency.       Positive for vaginal odor  Musculoskeletal:  Negative for back pain, joint pain and myalgias.  Skin:  Negative for itching and rash.  Neurological:  Negative for dizziness, tingling, tremors, sensory change, speech change, focal weakness, seizures, loss of consciousness, weakness and headaches.  Endo/Heme/Allergies:  Negative for environmental allergies. Does not bruise/bleed easily.  Psychiatric/Behavioral:  Negative for depression, hallucinations, memory loss, substance abuse and suicidal ideas. The patient is not nervous/anxious and does not have insomnia.        Objective:  Objective   Vitals:   05/24/21 1634  BP: 110/68  Weight: 197 lb (89.4 kg)  Height: 5\' 2"  (1.575 m)   Body mass index is 36.03 kg/m. Constitutional: Well nourished, well developed female in no acute distress.  HEENT: normal Skin: Warm and dry.  Cardiovascular: Regular  rate and rhythm.   Extremity:  no edema   Respiratory: Clear to auscultation bilateral. Normal respiratory effort Neuro: DTRs 2+, Cranial nerves grossly intact Psych: Alert and Oriented x3. No memory deficits. Normal mood and affect.   Pelvic exam: is not limited by body habitus EGBUS: within normal limits Vagina: within normal limits and with normal mucosa, no blood or discharge in vault Cervix: not evaluated   Assessment/Plan:     30 y.o. G3 P66 female with possible  BV  Vaginitis/STD swab Follow up as needed after lab results   [facility] 05/27/2021, 2:56 PM

## 2021-05-28 LAB — CERVICOVAGINAL ANCILLARY ONLY
Bacterial Vaginitis (gardnerella): NEGATIVE
Candida Glabrata: NEGATIVE
Candida Vaginitis: POSITIVE — AB
Chlamydia: NEGATIVE
Comment: NEGATIVE
Comment: NEGATIVE
Comment: NEGATIVE
Comment: NEGATIVE
Comment: NEGATIVE
Comment: NORMAL
Neisseria Gonorrhea: NEGATIVE
Trichomonas: NEGATIVE

## 2021-05-31 ENCOUNTER — Other Ambulatory Visit: Payer: Self-pay | Admitting: Advanced Practice Midwife

## 2021-05-31 DIAGNOSIS — B3731 Acute candidiasis of vulva and vagina: Secondary | ICD-10-CM

## 2021-05-31 MED ORDER — FLUCONAZOLE 150 MG PO TABS: 150.0000 mg | ORAL_TABLET | Freq: Once | ORAL | 1 refills | Status: AC

## 2021-05-31 NOTE — Progress Notes (Signed)
Rx diflucan sent to treat yeast infection. Message sent to patient. 

## 2021-06-27 ENCOUNTER — Other Ambulatory Visit: Payer: Self-pay | Admitting: Advanced Practice Midwife

## 2021-06-27 ENCOUNTER — Telehealth: Payer: Self-pay

## 2021-06-27 DIAGNOSIS — B3731 Acute candidiasis of vulva and vagina: Secondary | ICD-10-CM

## 2021-06-27 DIAGNOSIS — B9689 Other specified bacterial agents as the cause of diseases classified elsewhere: Secondary | ICD-10-CM

## 2021-06-27 MED ORDER — METRONIDAZOLE 500 MG PO TABS: 500.0000 mg | ORAL_TABLET | Freq: Two times a day (BID) | ORAL | 0 refills | Status: AC

## 2021-06-27 MED ORDER — FLUCONAZOLE 150 MG PO TABS: 150.0000 mg | ORAL_TABLET | Freq: Once | ORAL | 0 refills | Status: AC

## 2021-06-27 NOTE — Progress Notes (Signed)
Flagyl sent to treat BV per patient symptoms Diflucan sent in case yeast develops

## 2021-06-27 NOTE — Telephone Encounter (Signed)
Pt states she thinks she has BV can you send in a Rx. She has odor with discharge

## 2021-06-27 NOTE — Telephone Encounter (Signed)
Pt aware.

## 2021-08-14 ENCOUNTER — Encounter: Payer: Self-pay | Admitting: Obstetrics and Gynecology

## 2021-08-14 ENCOUNTER — Other Ambulatory Visit: Payer: Self-pay

## 2021-08-14 ENCOUNTER — Ambulatory Visit: Payer: Medicaid Other | Admitting: Obstetrics and Gynecology

## 2021-08-14 VITALS — BP 90/66 | Ht 62.0 in | Wt 196.0 lb

## 2021-08-14 DIAGNOSIS — N898 Other specified noninflammatory disorders of vagina: Secondary | ICD-10-CM | POA: Diagnosis not present

## 2021-08-14 DIAGNOSIS — R61 Generalized hyperhidrosis: Secondary | ICD-10-CM | POA: Diagnosis not present

## 2021-08-14 DIAGNOSIS — R350 Frequency of micturition: Secondary | ICD-10-CM

## 2021-08-14 DIAGNOSIS — Z113 Encounter for screening for infections with a predominantly sexual mode of transmission: Secondary | ICD-10-CM | POA: Diagnosis not present

## 2021-08-14 LAB — POCT URINALYSIS DIPSTICK
Bilirubin, UA: NEGATIVE
Glucose, UA: NEGATIVE
Ketones, UA: NEGATIVE
Leukocytes, UA: NEGATIVE
Nitrite, UA: NEGATIVE
Protein, UA: NEGATIVE
Spec Grav, UA: 1.025 (ref 1.010–1.025)
pH, UA: 8 (ref 5.0–8.0)

## 2021-08-14 LAB — POCT WET PREP WITH KOH
Clue Cells Wet Prep HPF POC: NEGATIVE
KOH Prep POC: NEGATIVE
Trichomonas, UA: NEGATIVE
Yeast Wet Prep HPF POC: NEGATIVE

## 2021-08-14 NOTE — Progress Notes (Signed)
Pcp, No   Chief Complaint  Patient presents with   Vaginal Odor    Abnormal, some discharge, no itchiness or irritation. Still going on since last visit   Urinary Tract Infection    Frequency urinating, no burning    HPI:      Jane Bowman is a 31 y.o. [id number] whose LMP was Patient's last menstrual period was 08/07/2021 (approximate)., presents today for vaginal odor that is sweaty and sometimes fishy, minimal itch occas, occas d/c, since 11/22 appt. Had Neg STD testing 11/22, [facility] culture showed yeast no BV/gardnerella (only 1 BV bacteria checked), treated with diflucan. Jane then sent in flagyl per pt sx 12/22 without sx relief.  Hx of BV in past. Is using scented soaps and dryer sheets, wears thongs and reg underwear, unsure if cotton. Has also noticed excessive sweating recently, in thighs/crotch, arms. Getting nicotine and caffeine.   Also with urinary frequency with good flow, some nocturia. No dysuria, hematuria. No LBP, pelvic pain, fevers. Drinks caffeine regularly and knows can cause sx. Hx of UTI in past with E. Coli on C&S. She is sex active, no new partners since neg STD testing 11/22.   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    Past Surgical History:  Procedure Laterality Date   CESAREAN SECTION  2015   [place]   INDUCED ABORTION  2013    Family History  Problem Relation Age of Onset   Hypertension Mother    Hypertension Father    Cancer Maternal Uncle 71       BRAIN STAGE 4   Hypertension Paternal Uncle    Hypertension Maternal Grandmother    Hypertension Paternal Grandfather    Breast cancer Other     Social History   Socioeconomic History   Marital status: Single    Spouse name: Not on file   Number of children: Not on file   Years of education: Not on file   Highest education level: Not on file  Occupational History   Not on file  Tobacco Use   Smoking status: Every Day   Smokeless  tobacco: Never  Vaping Use   Vaping Use: Never used  Substance and Sexual Activity   Alcohol use: Not Currently   Drug use: No   Sexual activity: Not Currently    Birth control/protection: None  Other Topics Concern   Not on file  Social History Narrative   Not on file   Social Determinants of Health   Financial Resource Strain: Not on file  Food Insecurity: Not on file  Transportation Needs: Not on file  Physical Activity: Not on file  Stress: Not on file  Social Connections: Not on file  Intimate Partner Violence: Not on file    Outpatient Medications Prior to Visit  Medication Sig Dispense Refill   valACYclovir (VALTREX) 500 MG tablet TAKE 1 TABLET(500 MG) BY MOUTH TWICE DAILY FOR 3 DAYS 30 tablet 0   norethindrone-ethinyl estradiol (LOESTRIN) 1-20 MG-MCG tablet Take 1 tablet by mouth daily. (Patient not taking: Reported on 05/24/2021) 84 tablet 3   phenazopyridine (PYRIDIUM) 200 MG tablet Take 1 tablet (200 mg total) by mouth 3 (three) times daily as needed for pain (urethral spasm). (Patient not taking: Reported on 02/05/2021) 10 tablet 1   No facility-administered medications prior to visit.      ROS:  Review of Systems  Constitutional:  Negative for fever.  Gastrointestinal:  Negative  for blood in stool, constipation, diarrhea, nausea and vomiting.  Genitourinary:  Positive for frequency and vaginal discharge. Negative for dyspareunia, dysuria, flank pain, hematuria, urgency, vaginal bleeding and vaginal pain.  Musculoskeletal:  Negative for back pain.  Skin:  Negative for rash.  BREAST: No symptoms   OBJECTIVE:   Vitals:  BP 90/66    Ht 5\' 2"  (1.575 m)    Wt 196 lb (88.9 kg)    LMP 08/07/2021 (Approximate)    BMI 35.85 kg/m   Physical Exam Vitals reviewed.  Constitutional:      Appearance: She is well-developed.  Pulmonary:     Effort: Pulmonary effort is normal.  Genitourinary:    General: Normal vulva.     Pubic Area: No rash.      Labia:         Right: No rash, tenderness or lesion.        Left: No rash, tenderness or lesion.      Vagina: Normal. No vaginal discharge, erythema or tenderness.     Cervix: Normal.     Uterus: Normal. Not enlarged and not tender.      Adnexa: Right adnexa normal and left adnexa normal.       Right: No mass or tenderness.         Left: No mass or tenderness.    Musculoskeletal:        General: Normal range of motion.     Cervical back: Normal range of motion.  Skin:    General: Skin is warm and dry.  Neurological:     General: No focal deficit present.     Mental Status: She is alert and oriented to person, place, and time.  Psychiatric:        Mood and Affect: Mood normal.        Behavior: Behavior normal.        Thought Content: Thought content normal.        Judgment: Judgment normal.    Results: Results for orders placed or performed in visit on 08/14/21 (from the past 24 hour(s))  POCT Urinalysis Dipstick     Status: Normal   Collection Time: 08/14/21  4:41 PM  Result Value Ref Range   Color, UA yellow    Clarity, UA clear    Glucose, UA Negative Negative   Bilirubin, UA neg    Ketones, UA neg    Spec Grav, UA 1.025 1.010 - 1.025   Blood, UA trace    pH, UA 8.0 5.0 - 8.0   Protein, UA Negative Negative   Urobilinogen, UA     Nitrite, UA neg    Leukocytes, UA Negative Negative   Appearance     Odor    POCT Wet Prep with KOH     Status: Normal   Collection Time: 08/14/21  4:42 PM  Result Value Ref Range   Trichomonas, UA Negative    Clue Cells Wet Prep HPF POC neg    Epithelial Wet Prep HPF POC     Yeast Wet Prep HPF POC neg    Bacteria Wet Prep HPF POC     RBC Wet Prep HPF POC     WBC Wet Prep HPF POC     KOH Prep POC Negative Negative     Assessment/Plan: Urinary frequency - Plan: POCT Urinalysis Dipstick, Urine Culture; neg UA. Check C&S. If neg, due to caffeine use. D/c caffeine, increase water  Vaginal odor - Plan: POCT Wet Prep with KOH, NuSwab  Vaginitis Plus  (VG+), neg wet prep, check  culture with other BV. If neg, most likely due to sweat. Dove sens skin soap, line dry underwear in case scented products causing bad smell.   Screening for STD (sexually transmitted disease) - Plan: NuSwab Vaginitis Plus (VG+)  Hyperhidrosis--d/c caffeine/nicotine. F/u with PCP for tx prn.     Return if symptoms worsen or fail to improve.  Eladio [place]remont B. Darrell Leonhardt, PA-C 08/14/2021 4:44 PM

## 2021-08-16 LAB — URINE CULTURE

## 2021-08-16 NOTE — Progress Notes (Signed)
Pls let pt know C&S neg. Sx most likely from caffeine. Thx

## 2021-08-17 LAB — NUSWAB VAGINITIS PLUS (VG+)
Candida albicans, NAA: NEGATIVE
Candida glabrata, NAA: NEGATIVE
Chlamydia trachomatis, NAA: NEGATIVE
Neisseria gonorrhoeae, NAA: NEGATIVE
Trich vag by NAA: NEGATIVE

## 2021-08-18 NOTE — Progress Notes (Signed)
Pls let pt know STD testing, BV and yeast all negative. D/C scented products as discussed which can cause odor. F/u prn.

## 2021-08-19 NOTE — Progress Notes (Signed)
Pt aware.

## 2021-08-30 ENCOUNTER — Telehealth: Payer: Self-pay

## 2021-08-30 NOTE — Telephone Encounter (Signed)
Patient aware.

## 2021-10-27 ENCOUNTER — Encounter: Payer: Self-pay | Admitting: Emergency Medicine

## 2021-10-27 ENCOUNTER — Ambulatory Visit
Admission: EM | Admit: 2021-10-27 | Discharge: 2021-10-27 | Disposition: A | Discharge status: Home / Self Care | Payer: Medicaid Other | Attending: Family Medicine | Admitting: Family Medicine

## 2021-10-27 DIAGNOSIS — J020 Streptococcal pharyngitis: Secondary | ICD-10-CM | POA: Diagnosis not present

## 2021-10-27 LAB — POCT RAPID STREP A (OFFICE): Rapid Strep A Screen: POSITIVE — AB

## 2021-10-27 MED ORDER — AZITHROMYCIN 250 MG PO TABS: ORAL_TABLET | ORAL | 0 refills | Status: DC

## 2021-10-27 NOTE — ED Triage Notes (Signed)
Pt presents with ST and congestion x 2 weeks. Pt developed a fever yesterday ?

## 2021-10-27 NOTE — ED Provider Notes (Signed)
?[facility] ? ? ? ?CSN: [id number] ?Arrival date & time: 10/27/21  1049 ? ? ?  ? ?History   ?Chief Complaint ?Chief Complaint  ?Patient presents with  ? Sore Throat  ? Fever  ? ? ?HPI ?Jane Bowman is a 31 y.o. female.  ? ?HPI ?Patient presents today for evaluation of sore throat x 2 days. She has experienced chills over the last two days. Her daughter is sick with the same symptoms. No other symptoms.  ?Past Medical History:  ?Diagnosis Date  ? Chlamydia   ? Genital herpes   ? H/O: C-section   ? ? ?Patient Active Problem List  ? Diagnosis Date Noted  ? 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 ? ?Past Surgical History:  ?Procedure Laterality Date  ? CESAREAN SECTION  2015  ? CCW  ? INDUCED ABORTION  2013  ? ? ?OB History   ? ? Gravida  ?3  ? Para  ?2  ? Term  ?1  ? Preterm  ?1  ? AB  ?1  ? Living  ?2  ?  ? ? SAB  ?   ? IAB  ?   ? Ectopic  ?   ? Multiple  ?   ? Live Births  ?2  ?   ?  ?  ? ? ? ?Home Medications   ? ?Prior to Admission medications   ?Medication Sig Start Date End Date Taking? Authorizing Provider  ?valACYclovir (VALTREX) 500 MG tablet TAKE 1 TABLET(500 MG) BY MOUTH TWICE DAILY FOR 3 DAYS 05/14/21   Copland, Ilona Sorrel, PA-C  ? ? ?Family History ?Family History  ?Problem Relation Age of Onset  ? Hypertension Mother   ? Hypertension Father   ? Cancer Maternal Uncle 28  ?     BRAIN STAGE 4  ? Hypertension Paternal Uncle   ? Hypertension Maternal Grandmother   ? Hypertension Paternal Grandfather   ? Breast cancer Other   ? ? ?Social History ?Social History  ? ?Tobacco Use  ? Smoking status: Every Day  ? Smokeless tobacco: Never  ?Vaping Use  ? Vaping Use: Never used  ?Substance Use Topics  ? Alcohol use: Not Currently  ? Drug use: No  ? ? ? ?Allergies   ?Amoxicillin ? ? ?Review of Systems ?Review of Systems ?Pertinent negatives listed in HPI  ? ?Physical Exam ?Triage Vital Signs ?ED Triage Vitals [10/27/21 1205]  ?Enc Vitals Group  ?   BP 117/77  ?   Pulse Rate 96  ?   Resp 18  ?   Temp 99.5  ?F (37.5 ?C)  ?   Temp Source Oral  ?   SpO2 96 %  ?   Weight   ?   Height   ?   Head Circumference   ?   Peak Flow   ?   Pain Score 10  ?   Pain Loc   ?   Pain Edu?   ?   Excl. in GC?   ? ?No data found. ? ?Updated Vital Signs ?BP 117/77 (BP Location: Left Arm)   Pulse 96   Temp 99.5 ?F (37.5 ?C) (Oral)   Resp 18   LMP 10/23/2021   SpO2 96%  ? ?Visual Acuity ?Right Eye Distance:   ?Left Eye Distance:   ?Bilateral Distance:   ? ?Right Eye Near:   ?Left Eye Near:    ?Bilateral Near:    ? ?Physical Exam ?Constitutional:   ?   Appearance:  She is ill-appearing.  ?HENT:  ?   Mouth/Throat:  ?   Pharynx: Uvula midline. Pharyngeal swelling, oropharyngeal exudate, posterior oropharyngeal erythema and uvula swelling present.  ?Eyes:  ?   Conjunctiva/sclera: Conjunctivae normal.  ?   Pupils: Pupils are equal, round, and reactive to light.  ?Cardiovascular:  ?   Rate and Rhythm: Normal rate and regular rhythm.  ?Pulmonary:  ?   Effort: Pulmonary effort is normal.  ?   Breath sounds: Normal breath sounds.  ?Skin: ?   General: Skin is warm and dry.  ?   Capillary Refill: Capillary refill takes less than 2 seconds.  ?Neurological:  ?   General: No focal deficit present.  ?   Mental Status: She is alert and oriented to person, place, and time.  ?Psychiatric:     ?   Mood and Affect: Mood normal.  ? ? ? ?UC Treatments / Results  ?Labs ?(all labs ordered are listed, but only abnormal results are displayed) ?Labs Reviewed  ?POCT RAPID STREP A (OFFICE) - Abnormal; Notable for the following components:  ?    Result Value  ? Rapid Strep A Screen Positive (*)   ? All other components within normal limits  ? ? ?EKG ? ? ?Radiology ?No results found. ? ?Procedures ?Procedures (including critical care time) ? ?Medications Ordered in UC ?Medications - No data to display ? ?Initial Impression / Assessment and Plan / UC Course  ?I have reviewed the triage vital signs and the nursing notes. ? ?Pertinent labs & imaging results that were  available during my care of the patient were reviewed by me and considered in my medical decision making (see chart for details). ? ?  ?Strep infection , rapid strep positive. ?Treatment Azithromycin due to penicillin allergy. ?Treat well with fluids.  Continue ibuprofen as needed.  Return if symptoms worsen or do not readily improve. ?Final Clinical Impressions(s) / UC Diagnoses  ? ?Final diagnoses:  ?Streptococcal sore throat  ? ?Discharge Instructions   ?None ?  ? ?ED Prescriptions   ? ? Medication Sig Dispense Auth. Provider  ? azithromycin (ZITHROMAX) 250 MG tablet Take 2 tabs PO x 1 dose, then 1 tab PO QD x 4 days 6 tablet Bing Neighbors, FNP  ? ?  ? ?PDMP not reviewed this encounter. ?  ?Bing Neighbors, FNP ?10/27/21 1258 ? ?

## 2021-11-22 ENCOUNTER — Telehealth: Payer: Self-pay

## 2021-11-22 NOTE — Telephone Encounter (Signed)
Pt left msg on triage requesting a call back. Called her back, ,she just found out she is and pregnant and is not planning on continuing pregnancy. Would like referral to clinic, per Crystal she does not referral to [facility]. ?

## 2022-02-07 ENCOUNTER — Encounter: Payer: Self-pay | Admitting: Licensed Practical Nurse

## 2022-02-07 ENCOUNTER — Ambulatory Visit (INDEPENDENT_AMBULATORY_CARE_PROVIDER_SITE_OTHER): Payer: Medicaid Other | Admitting: Licensed Practical Nurse

## 2022-02-07 VITALS — BP 106/70 | Ht 62.0 in | Wt 196.8 lb

## 2022-02-07 DIAGNOSIS — R5383 Other fatigue: Secondary | ICD-10-CM

## 2022-02-07 DIAGNOSIS — F320 Major depressive disorder, single episode, mild: Secondary | ICD-10-CM | POA: Diagnosis not present

## 2022-02-07 DIAGNOSIS — F419 Anxiety disorder, unspecified: Secondary | ICD-10-CM | POA: Diagnosis not present

## 2022-02-07 DIAGNOSIS — Z3009 Encounter for other general counseling and advice on contraception: Secondary | ICD-10-CM | POA: Diagnosis not present

## 2022-02-08 LAB — CBC WITH DIFFERENTIAL/PLATELET
Basophils Absolute: 0.1 10*3/uL (ref 0.0–0.2)
Basos: 1 %
EOS (ABSOLUTE): 0.3 10*3/uL (ref 0.0–0.4)
Eos: 4 %
Hematocrit: 40.6 % (ref 34.0–46.6)
Hemoglobin: 14.1 g/dL (ref 11.1–15.9)
Immature Grans (Abs): 0 10*3/uL (ref 0.0–0.1)
Immature Granulocytes: 0 %
Lymphocytes Absolute: 3.9 10*3/uL — ABNORMAL HIGH (ref 0.7–3.1)
Lymphs: 49 %
MCH: 33.2 pg — ABNORMAL HIGH (ref 26.6–33.0)
MCHC: 34.7 g/dL (ref 31.5–35.7)
MCV: 96 fL (ref 79–97)
Monocytes Absolute: 0.5 10*3/uL (ref 0.1–0.9)
Monocytes: 6 %
Neutrophils Absolute: 3.2 10*3/uL (ref 1.4–7.0)
Neutrophils: 40 %
Platelets: 276 10*3/uL (ref 150–450)
RBC: 4.25 x10E6/uL (ref 3.77–5.28)
RDW: 11.5 % — ABNORMAL LOW (ref 11.7–15.4)
WBC: 7.9 10*3/uL (ref 3.4–10.8)

## 2022-02-08 LAB — TSH+FREE T4
Free T4: 1.45 ng/dL (ref 0.82–1.77)
TSH: 0.835 u[IU]/mL (ref 0.450–4.500)

## 2022-02-10 ENCOUNTER — Telehealth: Payer: Self-pay

## 2022-02-10 DIAGNOSIS — F419 Anxiety disorder, unspecified: Secondary | ICD-10-CM | POA: Insufficient documentation

## 2022-02-10 DIAGNOSIS — F32A Depression, unspecified: Secondary | ICD-10-CM | POA: Insufficient documentation

## 2022-02-10 NOTE — Telephone Encounter (Signed)
Pt calling about lab work results. Please let me know when she calls me back. Pt does not have mychart so I cannot send a message

## 2022-02-10 NOTE — Progress Notes (Signed)
Obstetrics & Gynecology Office Visit   Chief Complaint:  Chief Complaint  Patient presents with   Contraception Management    Patient comes in office today to discuss concerns of side effect to birth conttrol. Patient reports that on medication she experiences headaches and, heavy fatigue and dizziness/light headiness. Patient states that she believes that medication is affecting her thyroid levels.     History of Present Illness: Pt was recently started on OCP's.  Since then she has had headaches. The headaches are frequent. The headache is in the back of neck and goes down towards her shoulders, the pain is "tense", she has not tried anything to relieve the pain as as she does not like to take medications. She wonders if her birth control is causing these headaches and would like to consider switching to another option.  She has tried the IUD in the past and did not like it, she will not get an Nexplanon and does not want depo.  She was 2 children at home and is certain she is child bearing. Currently her cycle are regular, they come monthly, last a few days and the flow is "normal".   Kanaya has also been experiencing fatigue, states "I lack motivation to do anything", desires her thyroid to be checked as she has read that an abnormal thyroid could cause fatigue. Did not admit to other thyroid symptoms. Currently does not have a PCP as she cannot find a provider that takes her insurance.   When asked about stress pt states "I am under stress all of the time" but did not wish to further discuss. When asked about Depression reports her mother took her to a doctor at the age of 31, she was prescribed Zoloft, she took the medication then-because she had no choice but stopped as soon as she could.  She may have been given another mood diagnosis at that time, but pt not sure. When trying to discuss her current feelings of depression state became upset stated "I am managing just fine", "no body will  take my insurance anyway".  Reminded pt I was here to help her and if she needed assistance seeing a psychiatrist, I can help, she stated "I am doing fine". Pt upset that I am asking so many questions. Again reminded pt that I was here to help, her symptoms of fatigue and the headache she describes could be caused by depression/anxiety and stress.    Review of Systems: fatigue, headaches, depression, anxiety   Past Medical History:  Past Medical History:  Diagnosis Date   Chlamydia    Genital herpes    H/O: C-section     Past Surgical History:  Past Surgical History:  Procedure Laterality Date   CESAREAN SECTION  2015   CCW   INDUCED ABORTION  2013    Gynecologic History: Patient's last menstrual period was 01/17/2022 (exact date).  Obstetric History: [id number]  Family History:  Family History  Problem Relation Age of Onset   Hypertension Mother    Hypertension Father    Cancer Maternal Uncle 25       BRAIN STAGE 4   Hypertension Paternal Uncle    Hypertension Maternal Grandmother    Hypertension Paternal Grandfather    Breast cancer Other     Social History:  Social History   Socioeconomic History   Marital status: Single    Spouse name: Not on file   Number of children: Not on file   Years of education:  Not on file   Highest education level: Not on file  Occupational History   Not on file  Tobacco Use   Smoking status: Every Day   Smokeless tobacco: Never  Vaping Use   Vaping Use: Never used  Substance and Sexual Activity   Alcohol use: Not Currently   Drug use: No   Sexual activity: Not Currently    Birth control/protection: None  Other Topics Concern   Not on file  Social History Narrative   Not on file   Social Determinants of Health   Financial Resource Strain: Not on file  Food Insecurity: Not on file  Transportation Needs: Not on file  Physical Activity: Not on file  Stress: Not on file  Social Connections: Not on file  Intimate Partner  Violence: Not on file    Allergies:  Allergies  Allergen Reactions   Amoxicillin Rash    Medications: Prior to Admission medications   Medication Sig Start Date End Date Taking? Authorizing Provider  norethindrone-ethinyl estradiol (LOESTRIN) 1-20 MG-MCG tablet Take 1 tablet by mouth daily. 11/12/21  Yes [provider]    Physical Exam Vitals:  Vitals:   02/07/22 1557  BP: 106/70   Patient's last menstrual period was 01/17/2022 (exact date).  General: NAD,  HEENT: normocephalic, anicteric Pulmonary: No increased work of breathing Neurologic: Grossly intact Psychiatric:agitated   PHQ-9 20 GAD-7 17  Assessment: 31 y.o. [id number] headaches, depression/anxiety, fatigue   Plan: Problem List Items Addressed This Visit   None Visit Diagnoses     Other fatigue    -  Primary   Relevant Orders   CBC w/Diff/Platelet (Completed)   TSH + free T4 (Completed)       Headaches: reviewed non pharmacologic comfort measures   Depression/Anxiety: offered referral to psych, recommended mindfulness activities   Contraception: discussed switching OCP to another formula, pt unsure as to what she would like to do.  Discuss bilateral tubal ligation, including irsks/benefits. Handout given, encouraged pt to scheduled an apt with Dr Logan Bores if she feels this is what she desires.    CBC and TSH sent, will call pt with abnormal results.   Carie Caddy, CNM  [facility]ulse, [facility]  02/10/22  5:54 AM

## 2022-02-11 ENCOUNTER — Telehealth: Payer: Self-pay | Admitting: Obstetrics

## 2022-02-11 NOTE — Telephone Encounter (Signed)
Okay! I gave her a call and we discussed.

## 2022-02-11 NOTE — Telephone Encounter (Signed)
Hi! Can you forward this to the provider who saw her/ordered the labs?  Thanks!  Missy

## 2022-02-11 NOTE — Telephone Encounter (Signed)
Pt Called after hour nurse 02/08/22 10:04am wanting to speak with someone about her lab results; she is not preg; she is worried and would like to speak with someone; states her thyroid level is elevated; she googled it and it said she had cancer.  After hour nurse paged [facility] who wanted to speak with pt - states thyroid levels are normal and so is her CBC; no note yet from MM[facility].  Left msg for pt to call and ask for either [facility] or me.

## 2022-02-11 NOTE — Telephone Encounter (Signed)
Pt returned call; adv thyroid tests were normal and CBC was normal; the highlighted parts of the CBC pt is concerned about; adv we don't worry about those as they are more information about the blood cells.  Pt wants to know why we don't worry about them; adv I did not have a answer for her and would send msg to a provider.  Same number is good.

## 2022-02-11 NOTE — Telephone Encounter (Addendum)
Was asked to reach out to [place] regarding her lab values since her provider is not available.  She is concerned about possible blood cancer. We reviewed her CBC and what the values mean. I reassured Angeni that there is nothing that would indicate cancer or other abnormalities, and the results are very good overall in the context of normal HCT, hgb, and WBC . Reviewed that normal values can have some variation among labs and individuals, and slightly elevated/decreased values from the listed levels do not typically represent serious abnormalities. She states that I am the third person she has spoken to about this since Saturday. Questions answered. Encouraged to reach out again if she has further concerns.    Quitman Livings, CNM

## 2022-06-03 ENCOUNTER — Telehealth: Payer: Self-pay

## 2022-06-03 ENCOUNTER — Other Ambulatory Visit: Payer: Self-pay

## 2022-06-03 DIAGNOSIS — N760 Acute vaginitis: Secondary | ICD-10-CM

## 2022-06-03 MED ORDER — METRONIDAZOLE 500 MG PO TABS: 500.0000 mg | ORAL_TABLET | Freq: Two times a day (BID) | ORAL | 0 refills | Status: DC

## 2022-06-03 NOTE — Telephone Encounter (Signed)
Pt calling for rx for BV.  [phone]  Pt states sxs are odor and doesn't feel right down there; states usually when she feels this way it's BV; adv will send in flagyl; pt prefers tablets; if this doesn't help will need to see a provider; pt requested rx for a yeast inf should she get one; adv to use monistat.  [facility] at [address].

## 2022-09-05 ENCOUNTER — Ambulatory Visit (INDEPENDENT_AMBULATORY_CARE_PROVIDER_SITE_OTHER): Payer: Medicaid Other

## 2022-09-05 ENCOUNTER — Other Ambulatory Visit (HOSPITAL_COMMUNITY)
Admission: RE | Admit: 2022-09-05 | Discharge: 2022-09-05 | Disposition: A | Discharge status: Home / Self Care | Payer: Medicaid Other | Source: Ambulatory Visit | Attending: Licensed Practical Nurse | Admitting: Licensed Practical Nurse

## 2022-09-05 VITALS — BP 104/72 | HR 85 | Ht 62.0 in | Wt 196.7 lb

## 2022-09-05 DIAGNOSIS — R319 Hematuria, unspecified: Secondary | ICD-10-CM

## 2022-09-05 DIAGNOSIS — Z113 Encounter for screening for infections with a predominantly sexual mode of transmission: Secondary | ICD-10-CM

## 2022-09-05 DIAGNOSIS — R8290 Unspecified abnormal findings in urine: Secondary | ICD-10-CM

## 2022-09-05 DIAGNOSIS — R102 Pelvic and perineal pain: Secondary | ICD-10-CM | POA: Diagnosis not present

## 2022-09-05 DIAGNOSIS — N898 Other specified noninflammatory disorders of vagina: Secondary | ICD-10-CM | POA: Diagnosis present

## 2022-09-05 DIAGNOSIS — R399 Unspecified symptoms and signs involving the genitourinary system: Secondary | ICD-10-CM | POA: Diagnosis not present

## 2022-09-05 LAB — POCT URINALYSIS DIPSTICK
Bilirubin, UA: NEGATIVE
Glucose, UA: NEGATIVE
Ketones, UA: NEGATIVE
Leukocytes, UA: NEGATIVE
Nitrite, UA: NEGATIVE
Protein, UA: NEGATIVE
Spec Grav, UA: 1.02 (ref 1.010–1.025)
Urobilinogen, UA: 0.2 E.U./dL
pH, UA: 5 (ref 5.0–8.0)

## 2022-09-05 MED ORDER — NITROFURANTOIN MONOHYD MACRO 100 MG PO CAPS: 100.0000 mg | ORAL_CAPSULE | Freq: Two times a day (BID) | ORAL | 0 refills | Status: DC

## 2022-09-05 NOTE — Progress Notes (Signed)
    NURSE VISIT NOTE  Subjective:    Patient ID: Jane Bowman, female    DOB: Jan 09, 1991, 32 y.o.   MRN: [id number]       HPI  Patient is a 32 y.o. [id number] female who presents for flank pain, abdominal pain, cloudy malordorous urine, genital irritation, and vaginal discharge for 2 week.  Patient does not have a history of recurrent UTI.  Patient does not have a history of pyelonephritis. She states wanting to be checked for STDs as well. NuSwab preformed. Patient states concerns of kidney stones or trouble after an abortion approximately 6 months ago.    Objective:    BP 104/72   Pulse 85   Ht 5' 2"$  (1.575 m)   Wt 196 lb 11.2 oz (89.2 kg)   LMP 08/26/2022 (Approximate)   BMI 35.98 kg/m    Lab Review  Results for orders placed or performed in visit on 09/05/22  POCT Urinalysis Dipstick  Result Value Ref Range   Color, UA yellow    Clarity, UA clear    Glucose, UA Negative Negative   Bilirubin, UA neg    Ketones, UA neg    Spec Grav, UA 1.020 1.010 - 1.025   Blood, UA large    pH, UA 5.0 5.0 - 8.0   Protein, UA Negative Negative   Urobilinogen, UA 0.2 0.2 or 1.0 E.U./dL   Nitrite, UA neg    Leukocytes, UA Negative Negative   Appearance     Odor      Assessment:   1. UTI symptoms   2. Pelvic pain   3. Abnormal urine odor   4. Vaginal discharge   5. Screening for STD (sexually transmitted disease)   6. Hematuria, unspecified type      Plan:  NuSwab sent. Urine Culture Sent. Treatment  Macrobid 100 mg PO BID for 7 days. Maintain adequate hydration.  May use AZO OTC prn.  Follow up if symptoms worsen or fail to improve as anticipated, and as needed.    Marykay Lex, CMA

## 2022-09-08 LAB — CERVICOVAGINAL ANCILLARY ONLY
Bacterial Vaginitis (gardnerella): NEGATIVE
Chlamydia: NEGATIVE
Comment: NEGATIVE
Comment: NEGATIVE
Comment: NEGATIVE
Comment: NORMAL
Neisseria Gonorrhea: NEGATIVE
Trichomonas: NEGATIVE

## 2022-09-08 LAB — URINE CULTURE

## 2022-09-10 ENCOUNTER — Other Ambulatory Visit: Payer: Self-pay

## 2022-09-10 ENCOUNTER — Encounter: Payer: Self-pay | Admitting: *Deleted

## 2022-09-10 DIAGNOSIS — K529 Noninfective gastroenteritis and colitis, unspecified: Secondary | ICD-10-CM | POA: Insufficient documentation

## 2022-09-10 DIAGNOSIS — R824 Acetonuria: Secondary | ICD-10-CM | POA: Insufficient documentation

## 2022-09-10 LAB — COMPREHENSIVE METABOLIC PANEL
ALT: 14 U/L (ref 0–44)
AST: 17 U/L (ref 15–41)
Albumin: 4 g/dL (ref 3.5–5.0)
Alkaline Phosphatase: 66 U/L (ref 38–126)
Anion gap: 8 (ref 5–15)
BUN: 14 mg/dL (ref 6–20)
CO2: 27 mmol/L (ref 22–32)
Calcium: 9.2 mg/dL (ref 8.9–10.3)
Chloride: 104 mmol/L (ref 98–111)
Creatinine, Ser: 0.68 mg/dL (ref 0.44–1.00)
GFR, Estimated: >60 mL/min (ref >60)
Glucose, Bld: 95 mg/dL (ref 70–99)
Potassium: 3.8 mmol/L (ref 3.5–5.1)
Sodium: 139 mmol/L (ref 135–145)
Total Bilirubin: 0.4 mg/dL (ref 0.3–1.2)
Total Protein: 7 g/dL (ref 6.5–8.1)

## 2022-09-10 LAB — CBC
HCT: 39 % (ref 36.0–46.0)
Hemoglobin: 13.3 g/dL (ref 12.0–15.0)
MCH: 32.8 pg (ref 26.0–34.0)
MCHC: 34.1 g/dL (ref 30.0–36.0)
MCV: 96.1 fL (ref 80.0–100.0)
Platelets: 292 10*3/uL (ref 150–400)
RBC: 4.06 MIL/uL (ref 3.87–5.11)
RDW: 11.9 % (ref 11.5–15.5)
WBC: 10.5 10*3/uL (ref 4.0–10.5)
nRBC: 0 % (ref 0.0–0.2)

## 2022-09-10 LAB — URINALYSIS, ROUTINE W REFLEX MICROSCOPIC
Bilirubin Urine: NEGATIVE
Glucose, UA: NEGATIVE mg/dL
Ketones, ur: 5 mg/dL — AB
Leukocytes,Ua: NEGATIVE
Nitrite: NEGATIVE
Protein, ur: NEGATIVE mg/dL
Specific Gravity, Urine: 1.027 (ref 1.005–1.030)
pH: 5 (ref 5.0–8.0)

## 2022-09-10 LAB — LIPASE, BLOOD: Lipase: 38 U/L (ref 11–51)

## 2022-09-10 LAB — POC URINE PREG, ED: Preg Test, Ur: NEGATIVE

## 2022-09-10 NOTE — ED Notes (Signed)
Poct pregnancy Negative

## 2022-09-10 NOTE — ED Triage Notes (Signed)
Pt has right lower abd pain for 6 days.  No n/v.  Pt saw her pmd and was treated for uti.  Pt taking abx for 3 days   pt alert  speech clear.

## 2022-09-11 ENCOUNTER — Telehealth: Payer: Self-pay

## 2022-09-11 ENCOUNTER — Emergency Department: Payer: Medicaid Other

## 2022-09-11 ENCOUNTER — Emergency Department
Admission: EM | Admit: 2022-09-11 | Discharge: 2022-09-11 | Disposition: A | Discharge status: Home / Self Care | Payer: Self-pay | Attending: Emergency Medicine | Admitting: Emergency Medicine

## 2022-09-11 DIAGNOSIS — R1031 Right lower quadrant pain: Secondary | ICD-10-CM

## 2022-09-11 DIAGNOSIS — K529 Noninfective gastroenteritis and colitis, unspecified: Secondary | ICD-10-CM

## 2022-09-11 MED ORDER — DICYCLOMINE HCL 10 MG PO CAPS
10.0000 mg | ORAL_CAPSULE | Freq: Once | ORAL | Status: DC
  Filled 2022-09-11: qty 1

## 2022-09-11 MED ORDER — DICYCLOMINE HCL 20 MG PO TABS: 20.0000 mg | ORAL_TABLET | Freq: Four times a day (QID) | ORAL | 1 refills | Status: DC | PRN

## 2022-09-11 NOTE — ED Notes (Signed)
Patient AOX4. Resp even, unlabored on RA. This RN acknowledged patient and introduced self to patient. Patient verbalized frustration with receiving treatment in a hallway bed. Patient preferred not to discuss symptoms in the hallway. Patient denies needs at this time.

## 2022-09-11 NOTE — ED Notes (Signed)
Patient states she did not come to the ER for medication but wishes to know if she has something seriously wrong with her. Declined dose of Bentyl. Patient consented to have vital signs updated. Patient continues to verbalize frustration with getting treatment in the hallway bed. Declines additional needs at this time.

## 2022-09-11 NOTE — Telephone Encounter (Signed)
-----   Message from Lurlean Horns, CNM sent at 09/10/2022  5:19 PM EST ----- Regarding: Negative labs Hi Collene Mares,  Can you please let Brenyn know that all her swabs came back negative? She doesn't have MyChart.  Thank you!  Missy

## 2022-09-11 NOTE — ED Provider Notes (Signed)
[facility] Provider Note    Event Date/Time   First MD Initiated Contact with Patient 09/11/22 0003     (approximate)   History   Abdominal Pain   HPI  Jane Bowman is a 32 y.o. female who presents to the ED for evaluation of Abdominal Pain   I reviewed obstetric clinic visit from 2/23 where she was evaluated for subacute abdominal pain, malodorous urine.  Started on Macrobid.  Vaginal swab negative for gonorrhea, chlamydia, trichomoniasis as well as BV.  Patient presents to the ED for evaluation of about 1 week of RLQ abdominal cramping pain and multiple weeks of diarrhea.  Reports no changes to the diarrhea alongside the pain.  No emesis.  No vaginal discharge or bleeding.  No fever   Physical Exam   Triage Vital Signs: ED Triage Vitals  Enc Vitals Group     BP 09/10/22 2054 117/77     Pulse Rate 09/10/22 2054 79     Resp 09/10/22 2054 18     Temp 09/10/22 2054 98.3 F (36.8 C)     Temp Source 09/10/22 2054 Oral     SpO2 09/10/22 2054 99 %     Weight 09/10/22 2050 195 lb (88.5 kg)     Height 09/10/22 2050 '5\' 2"'$  (1.575 m)     Head Circumference --      Peak Flow --      Pain Score 09/10/22 2050 5     Pain Loc --      Pain Edu? --      Excl. in [place]? --     Most recent vital signs: Vitals:   09/10/22 2054 09/11/22 0046  BP: 117/77 117/72  Pulse: 79 75  Resp: 18 20  Temp: 98.3 F (36.8 C)   SpO2: 99% 100%    General: Awake, no distress.  CV:  Good peripheral perfusion.  Resp:  Normal effort.  Abd:  No distention.  Soft and benign throughout. MSK:  No deformity noted.  Neuro:  No focal deficits appreciated. Other:     ED Results / Procedures / Treatments   Labs (all labs ordered are listed, but only abnormal results are displayed) Labs Reviewed  URINALYSIS, ROUTINE W REFLEX MICROSCOPIC - Abnormal; Notable for the following components:      Result Value   Color, Urine YELLOW (*)    APPearance HAZY (*)    Hgb urine  dipstick MODERATE (*)    Ketones, ur 5 (*)    Bacteria, UA FEW (*)    All other components within normal limits  LIPASE, BLOOD  COMPREHENSIVE METABOLIC PANEL  CBC  POC URINE PREG, ED    EKG   RADIOLOGY CT abdomen/pelvis interpreted by me without evidence of acute intra-abdominal pathology  Official radiology report(s): CT ABDOMEN PELVIS WO CONTRAST  Result Date: 09/11/2022 CLINICAL DATA:  Right-sided lower abdominal pain. EXAM: CT ABDOMEN AND PELVIS WITHOUT CONTRAST TECHNIQUE: Multidetector CT imaging of the abdomen and pelvis was performed following the standard protocol without IV contrast. RADIATION DOSE REDUCTION: This exam was performed according to the departmental dose-optimization program which includes automated exposure control, adjustment of the mA and/or kV according to patient size and/or use of iterative reconstruction technique. COMPARISON:  None Available. FINDINGS: Lower chest: No acute abnormality. Hepatobiliary: No focal liver abnormality is seen. No gallstones, gallbladder wall thickening, or biliary dilatation. Pancreas: Unremarkable. No pancreatic ductal dilatation or surrounding inflammatory changes. Spleen: Normal in size without focal abnormality. Adrenals/Urinary Tract:  Adrenal glands are unremarkable. Kidneys are normal, without renal calculi, focal lesion, or hydronephrosis. Bladder is unremarkable. Stomach/Bowel: Stomach is within normal limits. Appendix appears normal. No evidence of bowel wall thickening, distention, or inflammatory changes. Vascular/Lymphatic: No significant vascular findings are present. No enlarged abdominal or pelvic lymph nodes. Reproductive: Uterus and bilateral adnexa are unremarkable. Other: No abdominal wall hernia or abnormality. No abdominopelvic ascites. Musculoskeletal: No acute or significant osseous findings. IMPRESSION: 1. No CT evidence of acute abdominal/pelvic process. Electronically Signed   By: Virgina Norfolk M.D.   On:  09/11/2022 00:48    PROCEDURES and INTERVENTIONS:  Procedures  Medications  dicyclomine (BENTYL) capsule 10 mg (10 mg Oral Not Given 09/11/22 0050)     IMPRESSION / MDM / ASSESSMENT AND PLAN / ED COURSE  I reviewed the triage vital signs and the nursing notes.  Differential diagnosis includes, but is not limited to, appendicitis, irritable bowel, functional abdominal pain, ureteral colic or biliary colic, cystitis  {Patient presents with symptoms of an acute illness or injury that is potentially life-threatening.  32 year old female presents with nearly a week of RLQ abdominal pain superimposed on chronic diarrhea.  Looks systemically well and has benign abdominal examination.  Normal vital signs.  Blood work is reassuring with a normal CBC and metabolic panel.  Urine with small ketones suggestive of dehydration, but no infectious features.  CT obtained and without evidence of acute pathology.  The chronic diarrhea raises the possibility of irritable bowel and I offered her a prescription of dicyclomine.  We discussed PCP follow-up and return precautions.      FINAL CLINICAL IMPRESSION(S) / ED DIAGNOSES   Final diagnoses:  Right lower quadrant abdominal pain  Chronic diarrhea     Rx / DC Orders   ED Discharge Orders          Ordered    dicyclomine (BENTYL) 20 MG tablet  Every 6 hours PRN        09/11/22 0054             Note:  This document was prepared using Dragon voice recognition software and may include unintentional dictation errors.   Vladimir Crofts, MD 09/11/22 [phone]

## 2022-09-11 NOTE — Telephone Encounter (Signed)
Patient made aware stated she already knew results. No other question or concern.

## 2022-09-29 ENCOUNTER — Other Ambulatory Visit: Payer: Self-pay | Admitting: Obstetrics and Gynecology

## 2022-09-29 ENCOUNTER — Other Ambulatory Visit: Payer: Self-pay

## 2022-09-29 NOTE — Telephone Encounter (Signed)
Patient contacted office requesting refill for Loestrin, patient was last seen in office for contraception management on 02/07/22 and last physical was 02/05/21. I advised patient that she [place] due to schedule her annual physical, patient declined making appointment stating that she only has [employer] and states that they will not cover for her to have a annual physical. When I advised patient that annual physical should be covered under plan she states that she has found it doesn't. Patient is asking that we send in refills to her pharmacy at [place]. [place]. KW

## 2022-09-30 MED ORDER — NORETHINDRONE ACET-ETHINYL EST 1-20 MG-MCG PO TABS: 1.0000 | ORAL_TABLET | Freq: Every day | ORAL | 12 refills | Status: DC

## 2022-09-30 NOTE — Telephone Encounter (Signed)
Patient called in again today to get a update from yesterdays conversation.   Please advise.

## 2023-04-14 ENCOUNTER — Other Ambulatory Visit: Payer: Self-pay

## 2023-04-14 ENCOUNTER — Encounter: Payer: Self-pay | Admitting: Emergency Medicine

## 2023-04-14 ENCOUNTER — Emergency Department: Payer: Medicaid Other

## 2023-04-14 ENCOUNTER — Emergency Department
Admission: EM | Admit: 2023-04-14 | Discharge: 2023-04-14 | Disposition: A | Discharge status: Home / Self Care | Payer: Medicaid Other | Attending: Emergency Medicine | Admitting: Emergency Medicine

## 2023-04-14 DIAGNOSIS — R519 Headache, unspecified: Secondary | ICD-10-CM

## 2023-04-14 DIAGNOSIS — E236 Other disorders of pituitary gland: Secondary | ICD-10-CM | POA: Diagnosis not present

## 2023-04-14 LAB — BASIC METABOLIC PANEL
Anion gap: 10 (ref 5–15)
BUN: 8 mg/dL (ref 6–20)
CO2: 24 mmol/L (ref 22–32)
Calcium: 9.3 mg/dL (ref 8.9–10.3)
Chloride: 104 mmol/L (ref 98–111)
Creatinine, Ser: 0.64 mg/dL (ref 0.44–1.00)
GFR, Estimated: >60 mL/min (ref >60)
Glucose, Bld: 97 mg/dL (ref 70–99)
Potassium: 4.3 mmol/L (ref 3.5–5.1)
Sodium: 138 mmol/L (ref 135–145)

## 2023-04-14 LAB — CBC
HCT: 39.1 % (ref 36.0–46.0)
Hemoglobin: 14 g/dL (ref 12.0–15.0)
MCH: 33.9 pg (ref 26.0–34.0)
MCHC: 35.8 g/dL (ref 30.0–36.0)
MCV: 94.7 fL (ref 80.0–100.0)
Platelets: 319 10*3/uL (ref 150–400)
RBC: 4.13 MIL/uL (ref 3.87–5.11)
RDW: 11.9 % (ref 11.5–15.5)
WBC: 7.1 10*3/uL (ref 4.0–10.5)
nRBC: 0 % (ref 0.0–0.2)

## 2023-04-14 LAB — POC URINE PREG, ED: Preg Test, Ur: NEGATIVE

## 2023-04-14 MED ORDER — BUTALBITAL-APAP-CAFFEINE 50-325-40 MG PO TABS: 1.0000 | ORAL_TABLET | Freq: Four times a day (QID) | ORAL | 0 refills | Status: DC | PRN

## 2023-04-14 NOTE — ED Notes (Signed)
Pt d/c home per MD order. Discharge summary reviewed with pt, pt verbalizes understanding. No s/s of acute distress noted at discharge.  

## 2023-04-14 NOTE — Discharge Instructions (Addendum)
Call make an appointment with the neurology department at [facility].  Discharge papers have the phone number and address listed on.  A prescription for Fioricet was sent to the pharmacy for you to take as needed for your headache.  Be aware that this medication could cause drowsiness and do not drive or operate machinery while taking it.  Increase fluids to stay hydrated.

## 2023-04-14 NOTE — ED Triage Notes (Signed)
Pt here with a headache x9 days. Pt states she has not been diagnosed with migraines but her mother has a hx of them. \Pt ambulatory to triage.

## 2023-04-14 NOTE — ED Provider Notes (Signed)
[facility] Provider Note    Event Date/Time   First MD Initiated Contact with Patient 04/14/23 1018     (approximate)   History   Headache   HPI  Jane Bowman is a 32 y.o. female presents to the ED with complaint of generalized headache more on the right than the left for the last 9 days.  Patient states she has been taking over-the-counter medication particularly BC's without relief of her headache.  She reports that initially she had some photosensitivity but denies any nausea or vomiting.  No fever, chills or known sick exposures.  She reports that her mother has a history of migraine but a family member also has brain cancer.     Physical Exam   Triage Vital Signs: ED Triage Vitals  Encounter Vitals Group     BP 04/14/23 0956 139/85     Systolic BP Percentile --      Diastolic BP Percentile --      Pulse Rate 04/14/23 0956 (!) 111     Resp 04/14/23 0956 16     Temp 04/14/23 0956 99.1 F (37.3 C)     Temp Source 04/14/23 0956 Oral     SpO2 04/14/23 0956 99 %     Weight 04/14/23 0954 195 lb 1.7 oz (88.5 kg)     Height 04/14/23 0954 5\' 2"  (1.575 m)     Head Circumference --      Peak Flow --      Pain Score 04/14/23 0954 10     Pain Loc --      Pain Education --      Exclude from Growth Chart --     Most recent vital signs: Vitals:   04/14/23 0956 04/14/23 1227  BP: 139/85 109/69  Pulse: (!) 111 63  Resp: 16 16  Temp: 99.1 F (37.3 C)   SpO2: 99% 96%     General: Awake, no distress.  Slightly tearful but able to answer questions appropriately. CV:  Good peripheral perfusion.  Regular rate rhythm. Resp:  Normal effort.  Lungs are clear bilaterally. Abd:  No distention.  Other:  PERRLA, EOMI's, cranial nerves II through XII grossly intact.  Grip strength bilaterally is equal.  Patient is able to stand and ambulate without any assistance.  Speech is normal.   ED Results / Procedures / Treatments   Labs (all labs ordered  are listed, but only abnormal results are displayed) Labs Reviewed  CBC  BASIC METABOLIC PANEL  POC URINE PREG, ED      RADIOLOGY  CT head per radiologist no acute intracranial abnormality.  Empty sella.  Nonspecific finding that could be seen in the setting of idiopathic intracranial hypertension.   PROCEDURES:  Critical Care performed:   Procedures   MEDICATIONS ORDERED IN ED: Medications - No data to display   IMPRESSION / MDM / ASSESSMENT AND PLAN / ED COURSE  I reviewed the triage vital signs and the nursing notes.   Differential diagnosis includes, but is not limited to, space-occupying lesion, bleeding, migraine, cluster headache, muscle contraction headache.  32 year old female presents to the ED with complaint of headache for the last 9 days for which she has been taking BC without any relief of her headache.  Labs were reassuring.  A CT scan was performed with radiology report as noted above.  Patient drove herself to the emergency department therefore a headache cocktail was not given to her.  I explained that medication would  be sent to the pharmacy for her to take only when she is at home and no plans of driving or operating machinery.  Fioricet 1 every 6 hours was sent to her pharmacy and patient is aware that this could cause some drowsiness.  I discussed her CT findings with her and that a follow-up appointment with neurology would be beneficial as well as follow-up with her headache.  Patient was instructed to call [facility] neurology department to make an appointment.  A copy of the CT report was given to her at her request.      Patient's presentation is most consistent with acute illness / injury with system symptoms.  FINAL CLINICAL IMPRESSION(S) / ED DIAGNOSES   Final diagnoses:  Generalized headaches  Empty sella (HCC)     Rx / DC Orders   ED Discharge Orders          Ordered    butalbital-acetaminophen-caffeine (FIORICET) 50-325-40 MG  tablet  Every 6 hours PRN        04/14/23 1221             Note:  This document was prepared using Dragon voice recognition software and may include unintentional dictation errors.   Tommi Rumps, PA-C 04/14/23 1408    Jene Every, MD 04/14/23 [phone]

## 2023-04-14 NOTE — ED Notes (Signed)
See triage note  Presents with headache for the past 9 days  States she has taken OTC meds w/o relief  Low grade temp on arrival

## 2023-05-22 ENCOUNTER — Other Ambulatory Visit: Payer: Self-pay | Admitting: Neurology

## 2023-05-22 DIAGNOSIS — G35 Multiple sclerosis: Secondary | ICD-10-CM

## 2023-07-13 ENCOUNTER — Other Ambulatory Visit: Payer: Self-pay | Admitting: Licensed Practical Nurse

## 2023-07-24 IMAGING — CT CT ANGIO CHEST
2 of 6 series · 18 of 46 positions shown · IV contrast (APPLIED)
Comparison: None.

CLINICAL DATA: PE suspected, high prob

EXAM:
CT ANGIOGRAPHY CHEST WITH CONTRAST
TECHNIQUE: Multidetector CT imaging of the chest was performed using the
standard protocol during bolus administration of intravenous
contrast. Multiplanar CT image reconstructions and MIPs were
obtained to evaluate the vascular anatomy.
CONTRAST:  75mL OMNIPAQUE IOHEXOL 350 MG/ML SOLN

[Series 5: thins · axial · 0.67mm/px · z∈[+11,+240]mm · 15 of 312 slices shown]
[im 13/312  lung]
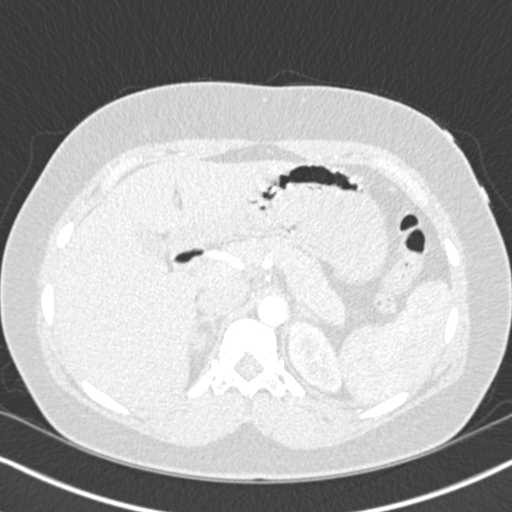
[im 39/312  soft-tissue]
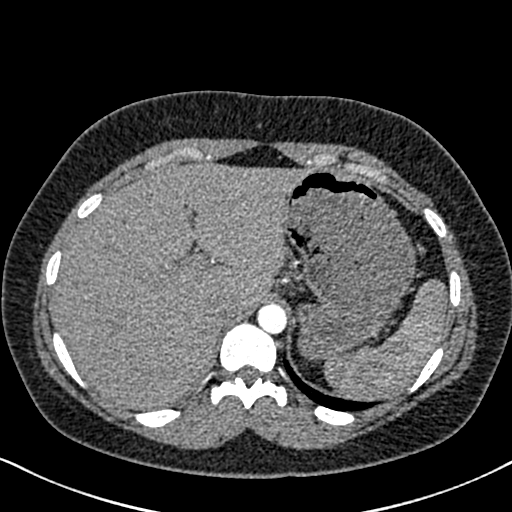
[im 52/312  lung]
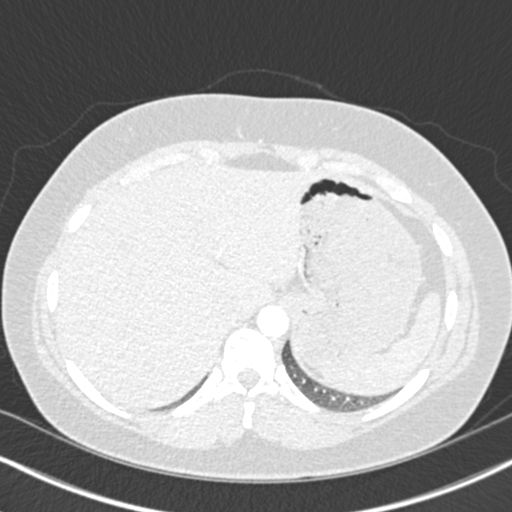
[im 78/312  soft-tissue]
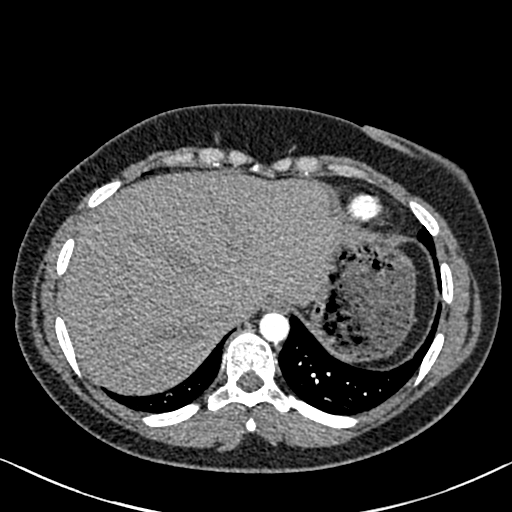
[im 91/312  lung]
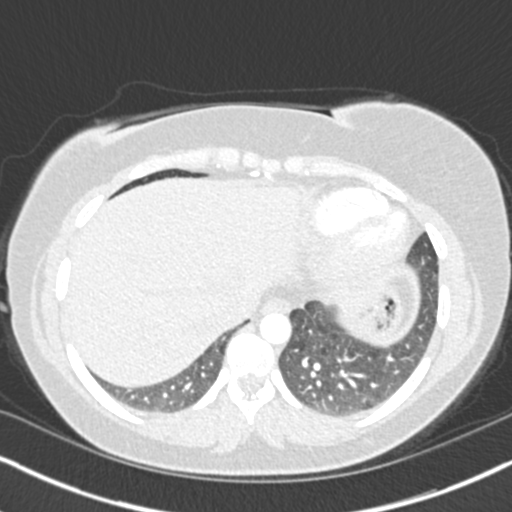
[im 117/312  soft-tissue]
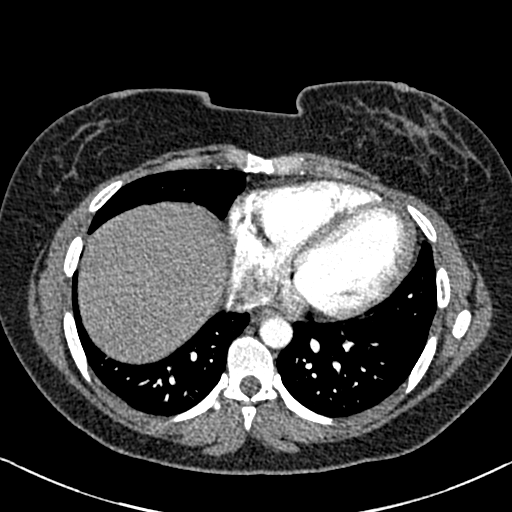
[im 130/312  lung]
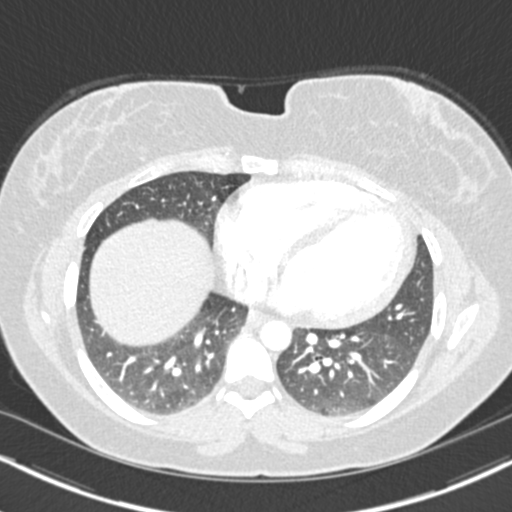
[im 156/312  soft-tissue]
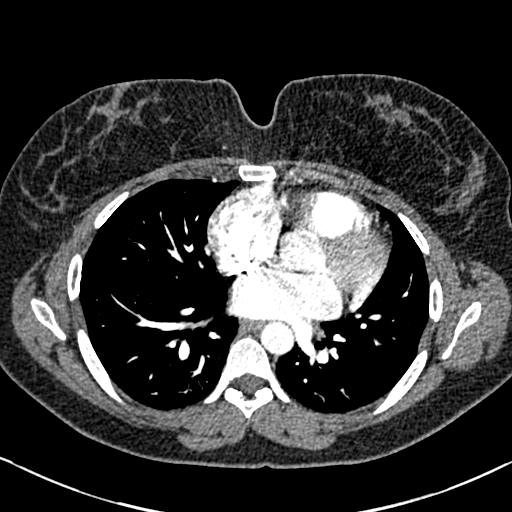
[im 182/312  lung]
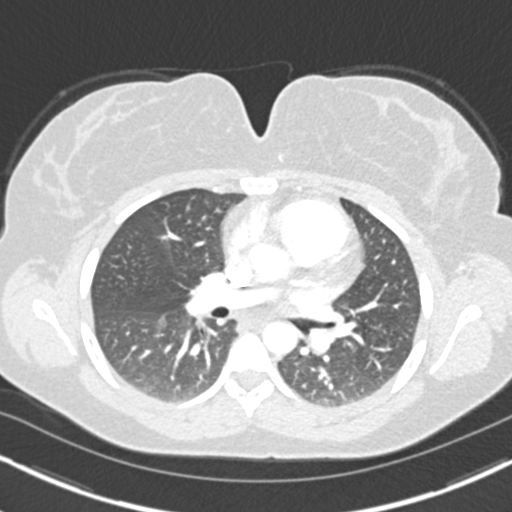
[im 195/312  soft-tissue]
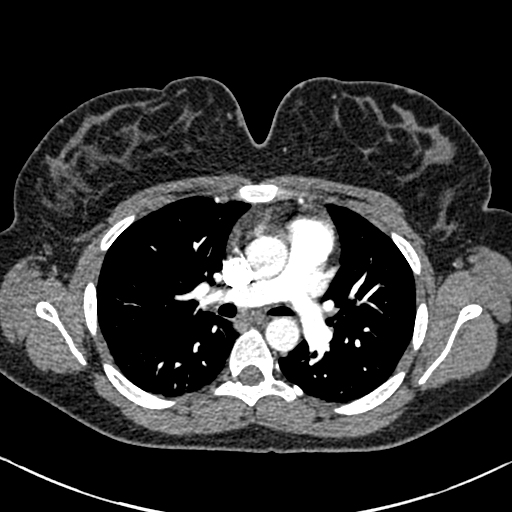
[im 221/312  lung]
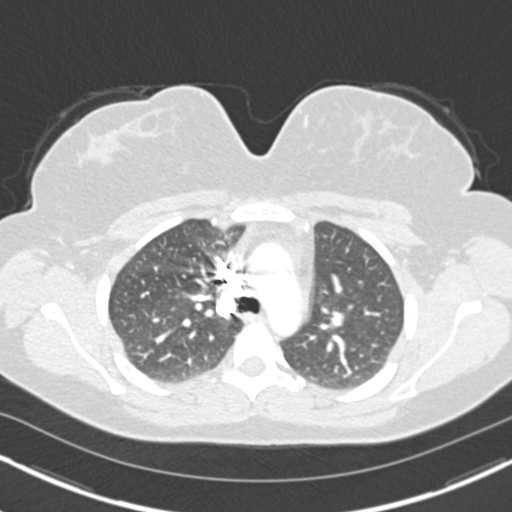
[im 234/312  soft-tissue]
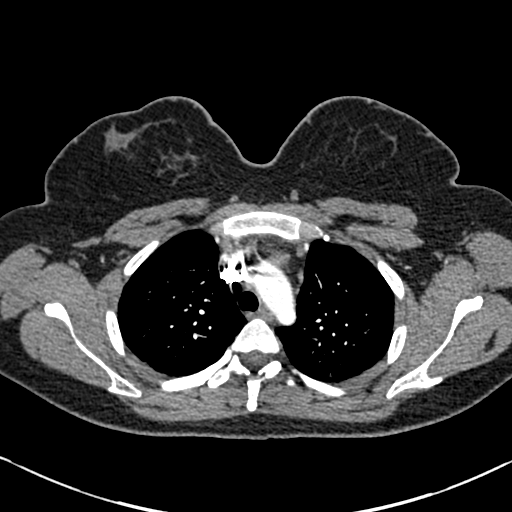
[im 260/312  lung]
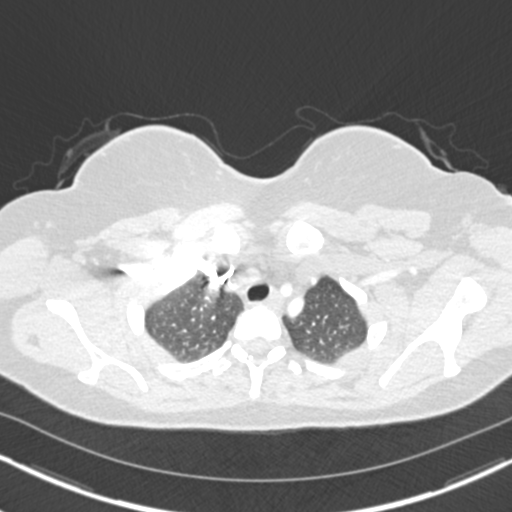
[im 273/312  soft-tissue]
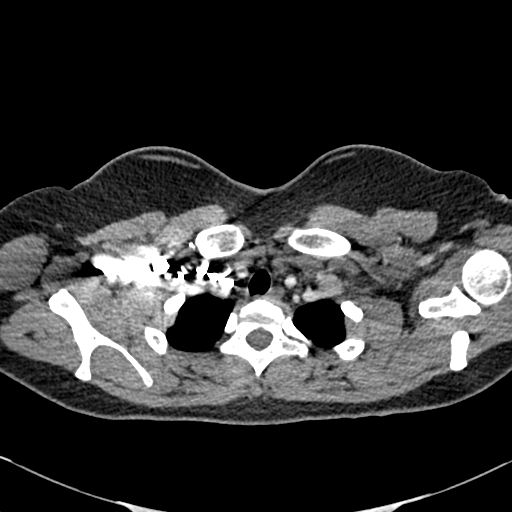
[im 299/312  lung]
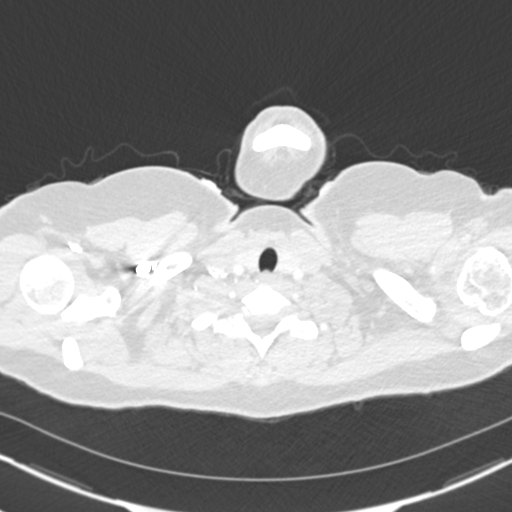

[Series 7: coronal mpr · coronal · 0.54mm/px · 3 of 83 slices shown]
[im 21/83  soft-tissue]
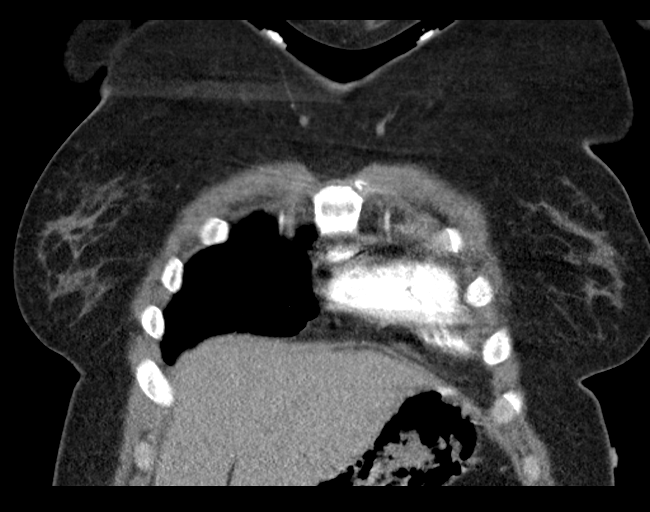
[im 42/83  soft-tissue]
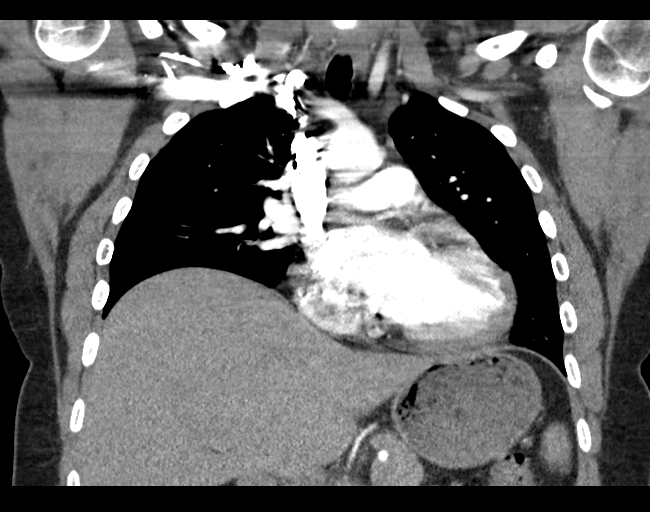
[im 62/83  soft-tissue]
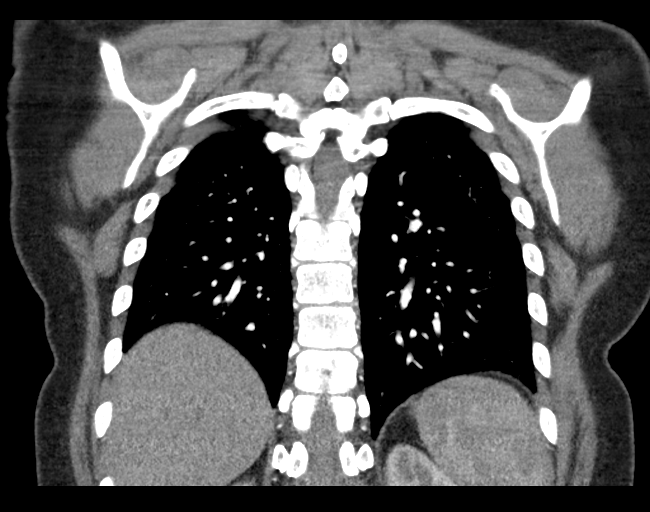

[18 of 46 positions shown; findings below may reference images not displayed]

FINDINGS: Cardiovascular: Satisfactory opacification of the pulmonary arteries
to the proximal segmental level. No evidence of pulmonary embolism.
Normal heart size. No pericardial effusion.

Mediastinum/Nodes: No enlarged mediastinal, hilar, or axillary lymph
nodes. Thyroid gland, trachea, and esophagus demonstrate no
significant findings.

Lungs/Pleura: Lungs are clear. No pleural effusion or pneumothorax.

Upper Abdomen: No acute abnormality.

Musculoskeletal: No chest wall abnormality. No acute or significant
osseous findings.

Review of the MIP images confirms the above findings.
IMPRESSION: No evidence of acute pulmonary embolism.  No consolidation.

## 2023-07-24 IMAGING — CR DG CHEST 2V
1 series · 2 of 2 positions shown · non-contrast
Comparison: November 27, 2016.

CLINICAL DATA: Chest pain radiating central chest.

EXAM:
CHEST - 2 VIEW

[Series 1: w chest pa · 0.14mm/px · 2 of 2 slices shown]
[im 1/2]
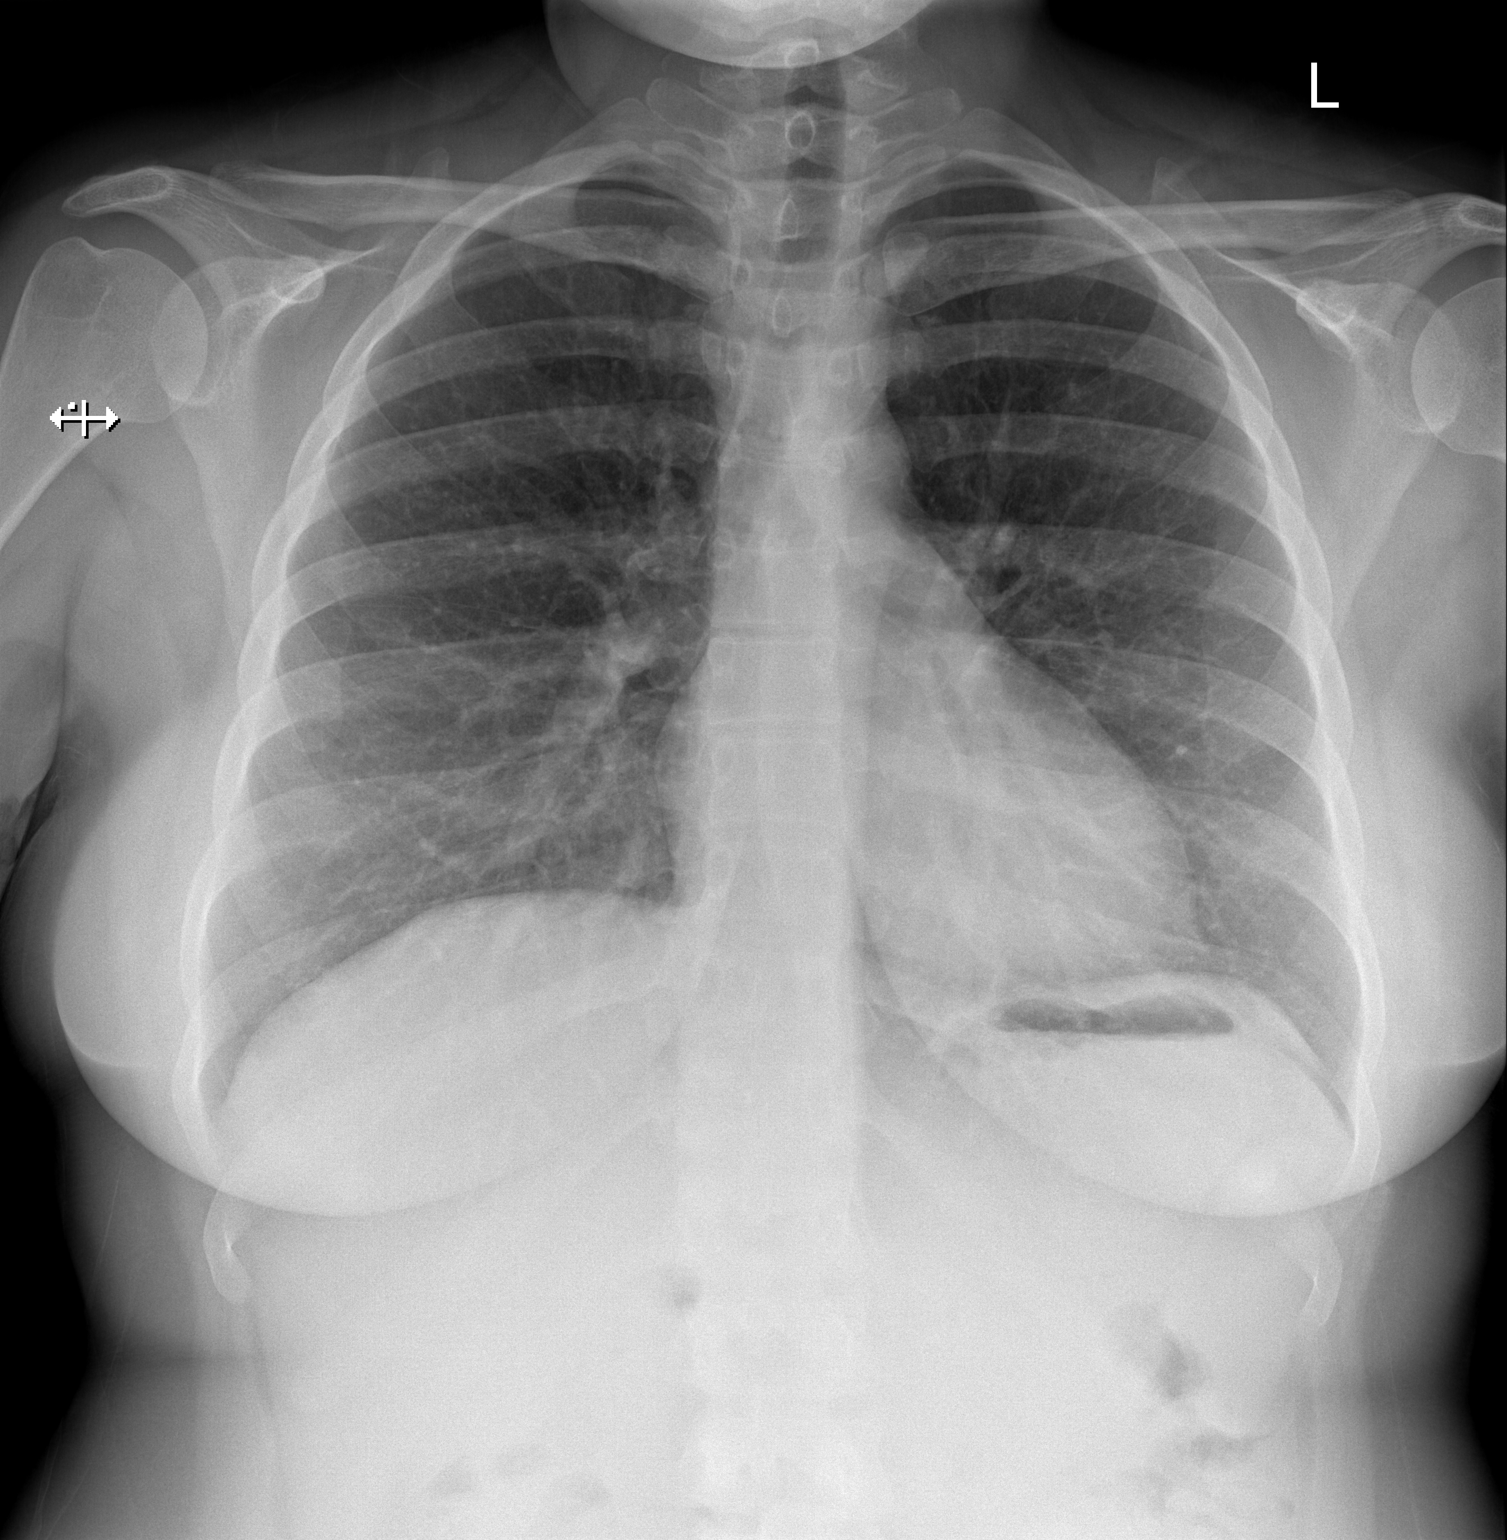
[im 2/2]
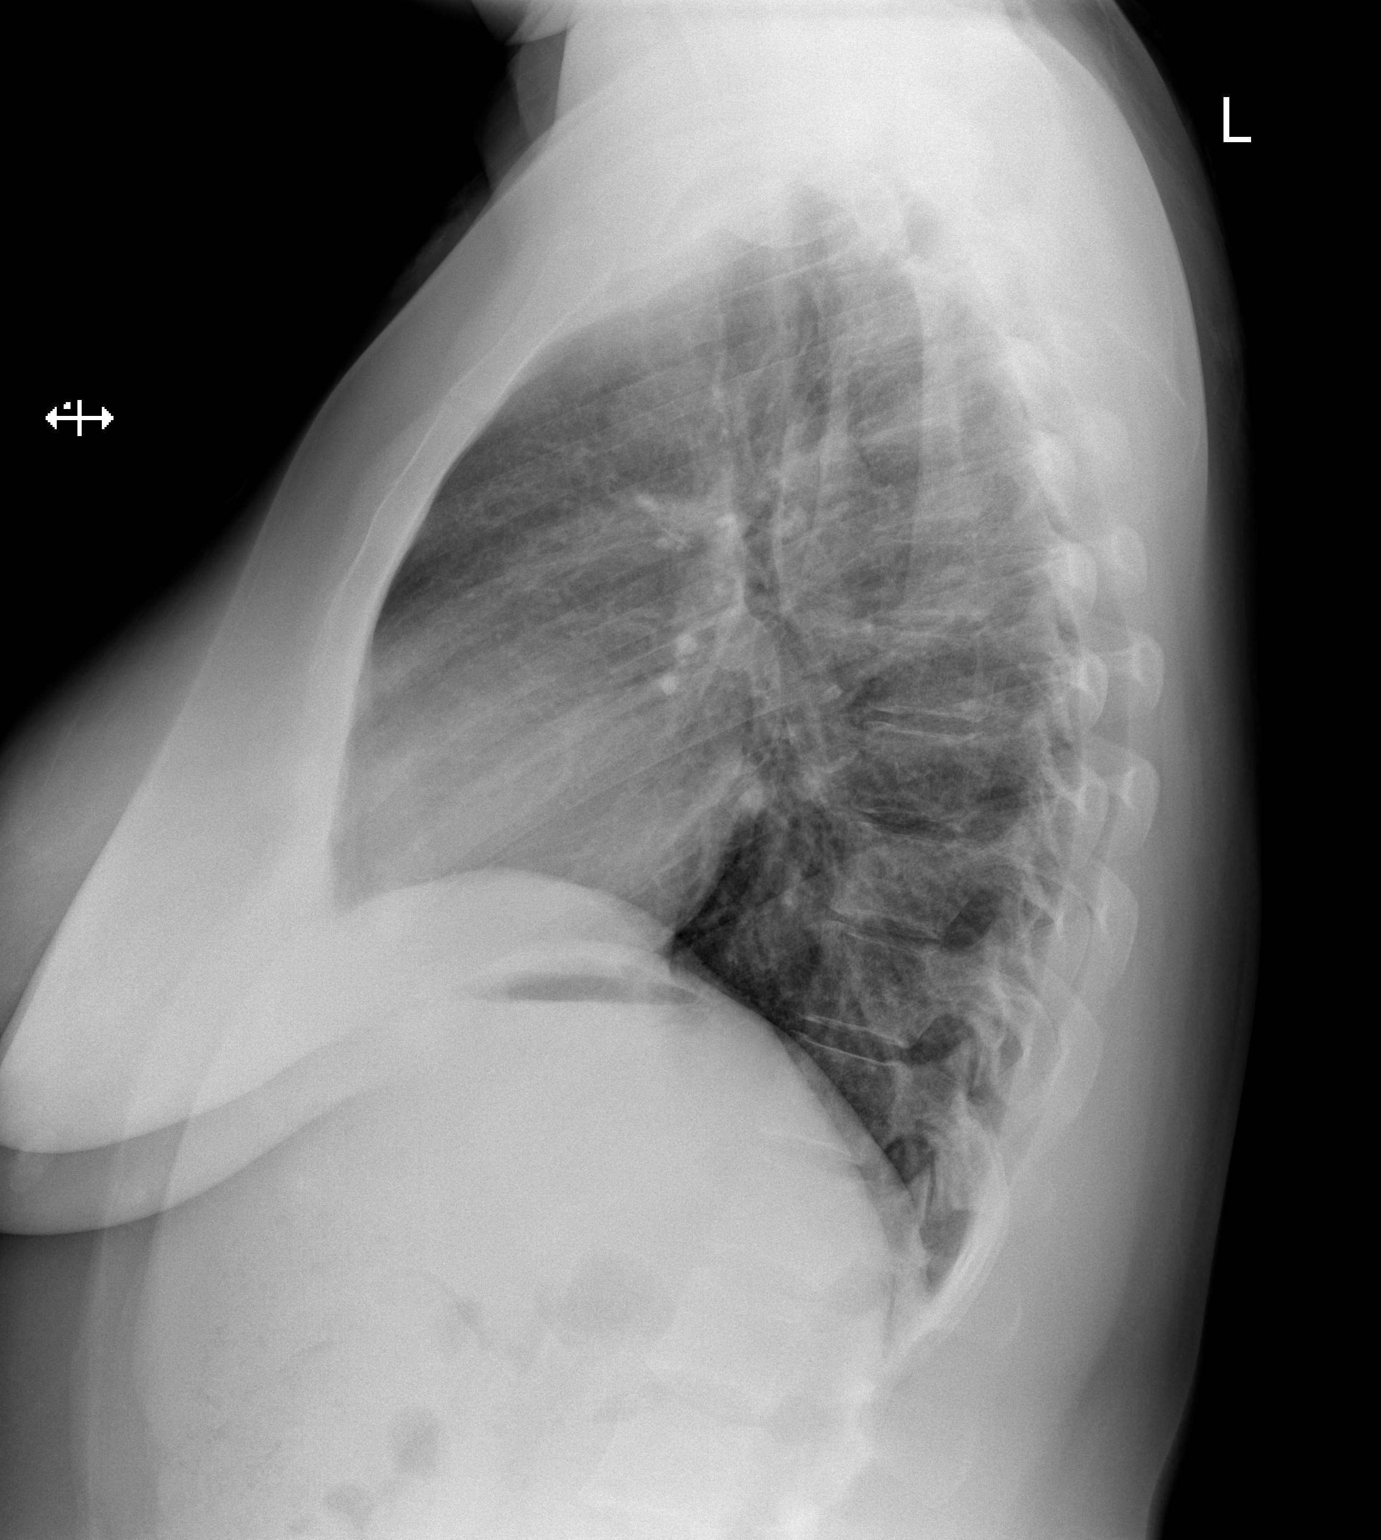

[2 of 2 positions shown; findings below may reference images not displayed]

FINDINGS: The heart size and mediastinal contours are within normal limits.
Both lungs are clear. The visualized skeletal structures are
unremarkable.
IMPRESSION: No active cardiopulmonary disease.

## 2023-09-11 ENCOUNTER — Encounter (HOSPITAL_BASED_OUTPATIENT_CLINIC_OR_DEPARTMENT_OTHER): Payer: Self-pay | Admitting: Urology

## 2023-09-11 DIAGNOSIS — W228XXA Striking against or struck by other objects, initial encounter: Secondary | ICD-10-CM | POA: Insufficient documentation

## 2023-09-11 DIAGNOSIS — Y92009 Unspecified place in unspecified non-institutional (private) residence as the place of occurrence of the external cause: Secondary | ICD-10-CM | POA: Diagnosis not present

## 2023-09-11 DIAGNOSIS — S0990XA Unspecified injury of head, initial encounter: Secondary | ICD-10-CM | POA: Diagnosis present

## 2023-09-11 DIAGNOSIS — S060X0A Concussion without loss of consciousness, initial encounter: Secondary | ICD-10-CM | POA: Insufficient documentation

## 2023-09-11 NOTE — ED Triage Notes (Signed)
 Pt states hit head on cabinet door that am at 0700  Denies LOC at time of  incident Did feel a little dizzy

## 2023-09-12 ENCOUNTER — Emergency Department (HOSPITAL_BASED_OUTPATIENT_CLINIC_OR_DEPARTMENT_OTHER)
Admission: EM | Admit: 2023-09-12 | Discharge: 2023-09-12 | Disposition: A | Discharge status: Home / Self Care | Payer: Medicaid Other | Attending: Emergency Medicine | Admitting: Emergency Medicine

## 2023-09-12 DIAGNOSIS — S060X0A Concussion without loss of consciousness, initial encounter: Secondary | ICD-10-CM

## 2023-09-12 NOTE — ED Provider Notes (Signed)
  [place] [facility] Provider Note   CSN: [id number] Arrival date & time: 09/11/23  2311     History  Chief Complaint  Patient presents with   Head Injury    Jane Bowman is a 33 y.o. female.  The history is provided by the patient.  Patient presents with head injury.  She reports around 7 AM on fibber 28th she hit her head on a cabinet at home.  No LOC or fall.  Since that time she has been dizzy, has a headache and is concerned about going to sleep. She has no other health conditions.  She is not on anticoagulation.  No other traumatic injuries reported     Home Medications Prior to Admission medications   Medication Sig Start Date End Date Taking? Authorizing Provider  butalbital-acetaminophen-caffeine (FIORICET) 50-325-40 MG tablet Take 1 tablet by mouth every 6 (six) hours as needed for headache. 04/14/23 04/13/24  Tommi Rumps, PA-C  dicyclomine (BENTYL) 20 MG tablet Take 1 tablet (20 mg total) by mouth every 6 (six) hours as needed for spasms (abdominal pain, bloating or diarrhea). 09/11/22   Delton Prairie, MD  norethindrone-ethinyl estradiol (LOESTRIN) 1-20 MG-MCG tablet Take 1 tablet by mouth daily. 09/30/22   Dominic, Courtney Heys, CNM      Allergies    Amoxicillin    Review of Systems   Review of Systems  Physical Exam Updated Vital Signs BP 126/63 (BP Location: Right Arm)   Pulse 63   Temp 97.9 F (36.6 C) (Oral)   Resp 19   Ht 1.575 m (5\' 2" )   Wt 88.5 kg   SpO2 99%   BMI 35.69 kg/m  Physical Exam CONSTITUTIONAL: Well developed/well nourished HEAD: Mild swelling to forehead, no other signs of trauma or step-offs or crepitus EYES: EOMI/PERRL ENMT: Mucous membranes moist NECK: supple no meningeal signs SPINE/BACK: No cervical spine tenderness NEURO: Pt is awake/alert/appropriate, moves all extremitiesx4.  No facial droop.  GCS 15, no focal weakness, ambulatory without difficulty EXTREMITIES:  full ROM   ED  Results / Procedures / Treatments   Labs (all labs ordered are listed, but only abnormal results are displayed) Labs Reviewed - No data to display  EKG None  Radiology No results found.  Procedures Procedures    Medications Ordered in ED Medications - No data to display  ED Course/ Medical Decision Making/ A&P           Glasgow Coma Scale Score: 15                      Medical Decision Making  Patient presents after bumping her head at home this occurred just under 24 hours ago.  No LOC or vomiting.  She has an appropriate mental status and she can walk without difficulty.  Suspect mild concussion.  She is safe for discharge        Final Clinical Impression(s) / ED Diagnoses Final diagnoses:  Concussion without loss of consciousness, initial encounter    Rx / DC Orders ED Discharge Orders     None         Zadie Rhine, MD 09/12/23 [phone]

## 2023-09-12 NOTE — ED Notes (Signed)
 Pt ambulatory from lobby to room. Reports ongoing head pain from injury yesterday. Denies lightheadedness and dizziness. No nausea and vomiting.

## 2023-09-30 ENCOUNTER — Other Ambulatory Visit: Payer: Self-pay | Admitting: Obstetrics and Gynecology

## 2023-09-30 ENCOUNTER — Other Ambulatory Visit: Payer: Self-pay

## 2023-09-30 ENCOUNTER — Other Ambulatory Visit: Payer: Self-pay | Admitting: Licensed Practical Nurse

## 2023-09-30 MED ORDER — NORETHINDRONE ACET-ETHINYL EST 1-20 MG-MCG PO TABS: 1.0000 | ORAL_TABLET | Freq: Every day | ORAL | 2 refills | Status: DC

## 2023-09-30 NOTE — Telephone Encounter (Signed)
 Pt calling triage requesting BC RF. Advised she is overdue for her annual. Annual has been scheduled with [facility] 10/22/23. [facility] RF sent, pt aware.

## 2023-10-22 ENCOUNTER — Ambulatory Visit: Payer: Self-pay | Admitting: Obstetrics and Gynecology

## 2023-10-27 NOTE — Telephone Encounter (Addendum)
 Patient is scheduled for annual 4/24. Her Loestrin runs out tomorrow. Requesting a refill. Advised refill sent on 3/19 for #28 w/2 refills.

## 2023-10-29 NOTE — Progress Notes (Signed)
 PCP:  Patient, No Pcp Per   Chief Complaint  Patient presents with   Gynecologic Exam    Std testing, no other concerns.     HPI:      [name] is a 33 y.o. No obstetric history on file. who LMP was Patient's last menstrual period was 10/24/2023 (approximate)., presents today for her annual examination.  Her menses are regular every 28-30 days,  lasting 3 days on OCPs.  Dysmenorrhea none. She does not have intermenstrual bleeding.   Sex activity: single partner, contraception - OCP (estrogen/progesterone). No pain/bleeding/dryness. Didn't like IUD in past, declines nexplanon/depo Last Pap: 02/05/21  Results were: no abnormalities  Hx of STDs: chlamydia, HSV--takes valtrex  prn sx, doesn't need RF currently. Neg STD testing 2/24, would like done again today. No known exposures.  Hx of BV in past, no recent sx.   There is a FH of breast cancer in her mat grt aunt, genetic testing not indicated. There is no FH of ovarian cancer. The patient does not do self-breast exams.  Tobacco use: The patient currently smokes 1/2 packs of cigarettes per day for the past many years.   Alcohol use: occas No drug use.  Exercise: min active  She does not get adequate calcium or Vitamin D in her diet.  Past Medical History:  Diagnosis Date   Chlamydia    Genital herpes    H/O: C-section     Past Surgical History:  Procedure Laterality Date   CESAREAN SECTION  2015   CCW   INDUCED ABORTION  2013    Family History  Problem Relation Age of Onset   Hypertension Mother    Hypertension Father    Cancer Maternal Uncle 107       BRAIN STAGE 4   Hypertension Paternal Uncle    Hypertension Maternal Grandmother    Hypertension Paternal Grandfather    Breast cancer Other     Social History   Socioeconomic History   Marital status: Single    Spouse name: Not on file   Number of children: Not on file   Years of education: Not on file   Highest education level: Not on file   Occupational History   Not on file  Tobacco Use   Smoking status: Every Day    Types: Cigarettes   Smokeless tobacco: Never  Vaping Use   Vaping status: Never Used  Substance and Sexual Activity   Alcohol use: Yes    Comment: occ   Drug use: No   Sexual activity: Yes    Birth control/protection: Pill  Other Topics Concern   Not on file  Social History Narrative   Not on file   Social Drivers of Health   Financial Resource Strain: Not on file  Food Insecurity: Not on file  Transportation Needs: Not on file  Physical Activity: Not on file  Stress: Not on file  Social Connections: Not on file  Intimate Partner Violence: Not on file    Current Meds  Medication Sig   valACYclovir  (VALTREX ) 500 MG tablet Take 500 mg by mouth.   [DISCONTINUED] norethindrone -ethinyl estradiol (LOESTRIN) 1-20 MG-MCG tablet Take 1 tablet by mouth daily.     ROS:  Review of Systems  Constitutional:  Negative for fatigue, fever and unexpected weight change.  Respiratory:  Negative for cough, shortness of breath and wheezing.   Cardiovascular:  Negative for chest pain, palpitations and leg swelling.  Gastrointestinal:  Negative for blood in stool, constipation,  diarrhea, nausea and vomiting.  Endocrine: Negative for cold intolerance, heat intolerance and polyuria.  Genitourinary:  Negative for dyspareunia, dysuria, flank pain, frequency, genital sores, hematuria, menstrual problem, pelvic pain, urgency, vaginal bleeding, vaginal discharge and vaginal pain.  Musculoskeletal:  Negative for back pain, joint swelling and myalgias.  Skin:  Negative for rash.  Neurological:  Negative for dizziness, syncope, light-headedness, numbness and headaches.  Hematological:  Negative for adenopathy.  Psychiatric/Behavioral:  Negative for agitation, confusion, sleep disturbance and suicidal ideas. The patient is not nervous/anxious.      Objective: BP 119/77   Pulse 80   Ht 5\' 2"  (1.575 m)   Wt 189 lb  (85.7 kg)   LMP 10/24/2023 (Approximate)   BMI 34.57 kg/m    Physical Exam Constitutional:      Appearance: She is well-developed.  Genitourinary:     Vulva normal.     Right Labia: No rash, tenderness or lesions.    Left Labia: No tenderness, lesions or rash.    No vaginal discharge, erythema or tenderness.      Right Adnexa: not tender and no mass present.    Left Adnexa: not tender and no mass present.    No cervical friability or polyp.     Uterus is not enlarged or tender.  Breasts:    Right: No mass, nipple discharge, skin change or tenderness.     Left: No mass, nipple discharge, skin change or tenderness.  Neck:     Thyroid: No thyromegaly.  Cardiovascular:     Rate and Rhythm: Normal rate and regular rhythm.     Heart sounds: Normal heart sounds. No murmur heard. Pulmonary:     Effort: Pulmonary effort is normal.     Breath sounds: Normal breath sounds.  Chest:    Abdominal:     Palpations: Abdomen is soft.     Tenderness: There is no abdominal tenderness. There is no guarding or rebound.  Musculoskeletal:        General: Normal range of motion.     Cervical back: Normal range of motion.  Lymphadenopathy:     Cervical: No cervical adenopathy.  Neurological:     General: No focal deficit present.     Mental Status: She is alert and oriented to person, place, and time.     Cranial Nerves: No cranial nerve deficit.  Skin:    General: Skin is warm and dry.  Psychiatric:        Mood and Affect: Mood normal.        Behavior: Behavior normal.        Thought Content: Thought content normal.        Judgment: Judgment normal.  Vitals reviewed.    2 SKIN TAGS REMOVED ON LT BREAST. AREA CLEANED WITH ALCOHOL. LARGER LESION NUMBER WITH 1% LIDOCAINE. NO BLEEDING WITH EITHER REMOVAL. BANDAID APPLIED, WOUND CARE INSTRUCTIONS TOLD TO PT. F/U PRN ANY SIGNS OF INFECTION.  Assessment/Plan:  Encounter for annual routine gynecological examination  Cervical cancer  screening - Plan: Cytology - PAP  Screening for STD (sexually transmitted disease) - Plan: Cytology - PAP, HIV Antibody (routine testing w rflx), RPR, Hepatitis C antibody  Screening for HPV (human papillomavirus) - Plan: Cytology - PAP  Encounter for surveillance of contraceptive pills - Plan: norethindrone -ethinyl estradiol (LOESTRIN) 1-20 MG-MCG tablet; OCP RF eRxd  Herpes simplex vulvovaginitis--will call for HSV RF prn  Skin tag - Plan: Surgical pathology; 2 skin tags removed LT breast, no bleeding. F/u prn.  Meds ordered this encounter  Medications   norethindrone -ethinyl estradiol (LOESTRIN) 1-20 MG-MCG tablet    Sig: Take 1 tablet by mouth daily.    Dispense:  84 tablet    Refill:  3    Supervising Provider:   ROBY, MICIA [[id number]]             GYN counsel adequate intake of calcium and vitamin D, diet and exercise, tobacco cessation.     F/U  Return in about 1 year (around 11/04/2024).  Jane Schuenemann B. Estrellita Lasky, PA-C 11/05/2023 9:15 AM

## 2023-11-05 ENCOUNTER — Other Ambulatory Visit (HOSPITAL_COMMUNITY)
Admission: RE | Admit: 2023-11-05 | Discharge: 2023-11-05 | Disposition: A | Discharge status: Home / Self Care | Source: Ambulatory Visit | Attending: Obstetrics and Gynecology | Admitting: Obstetrics and Gynecology

## 2023-11-05 ENCOUNTER — Encounter: Payer: Self-pay | Admitting: Obstetrics and Gynecology

## 2023-11-05 ENCOUNTER — Ambulatory Visit (INDEPENDENT_AMBULATORY_CARE_PROVIDER_SITE_OTHER): Payer: Self-pay | Admitting: Obstetrics and Gynecology

## 2023-11-05 VITALS — BP 119/77 | HR 80 | Ht 62.0 in | Wt 189.0 lb

## 2023-11-05 DIAGNOSIS — A6004 Herpesviral vulvovaginitis: Secondary | ICD-10-CM

## 2023-11-05 DIAGNOSIS — Z124 Encounter for screening for malignant neoplasm of cervix: Secondary | ICD-10-CM

## 2023-11-05 DIAGNOSIS — Z1151 Encounter for screening for human papillomavirus (HPV): Secondary | ICD-10-CM | POA: Diagnosis present

## 2023-11-05 DIAGNOSIS — Z113 Encounter for screening for infections with a predominantly sexual mode of transmission: Secondary | ICD-10-CM

## 2023-11-05 DIAGNOSIS — Z01419 Encounter for gynecological examination (general) (routine) without abnormal findings: Secondary | ICD-10-CM

## 2023-11-05 DIAGNOSIS — L918 Other hypertrophic disorders of the skin: Secondary | ICD-10-CM

## 2023-11-05 DIAGNOSIS — Z3041 Encounter for surveillance of contraceptive pills: Secondary | ICD-10-CM

## 2023-11-05 MED ORDER — NORETHINDRONE ACET-ETHINYL EST 1-20 MG-MCG PO TABS: 1.0000 | ORAL_TABLET | Freq: Every day | ORAL | 3 refills | Status: AC

## 2023-11-05 NOTE — Patient Instructions (Signed)
 I value your feedback and you entrusting [place] with your care. If you get a [place] patient survey, I would appreciate you taking the time to let us know about your experience today. Thank you! ? ? ?

## 2023-11-07 LAB — RPR: RPR Ser Ql: NONREACTIVE

## 2023-11-07 LAB — HIV ANTIBODY (ROUTINE TESTING W REFLEX): HIV Screen 4th Generation wRfx: NONREACTIVE

## 2023-11-07 LAB — HEPATITIS C ANTIBODY: Hep C Virus Ab: NONREACTIVE

## 2023-11-09 LAB — SURGICAL PATHOLOGY

## 2023-11-09 LAB — CYTOLOGY - PAP
Chlamydia: NEGATIVE
Comment: NEGATIVE
Comment: NEGATIVE
Comment: NEGATIVE
Comment: NORMAL
Diagnosis: NEGATIVE
High risk HPV: NEGATIVE
Neisseria Gonorrhea: NEGATIVE
Trichomonas: NEGATIVE

## 2024-01-22 ENCOUNTER — Ambulatory Visit: Payer: Self-pay

## 2024-01-22 NOTE — Telephone Encounter (Signed)
 FYI Only or Action Required?: FYI only for provider.  Patient was last seen in primary care on n/a.  Called Nurse Triage reporting Pain.  Symptoms began several months ago.  Interventions attempted: Nothing.  Symptoms are: gradually worsening.  Triage Disposition: See PCP Within 2 Weeks  Patient/caregiver understands and will follow disposition?: Yes  Copied from CRM [id number]. Topic: Clinical - Red Word Triage >> Jan 22, 2024 12:04 PM Harlene ORN wrote: Red Word that prompted transfer to Nurse Triage: un established patient says she is  extremely fatigued tingling in arms hands and feet pressure in neck and head dizzy Reason for Disposition  [1] MILD dizziness (e.g., walking normally) AND [2] has been evaluated by doctor (or NP/PA) for this  [1] MILD pain (e.g., does not interfere with normal activities) AND [2] present > 7 days  Answer Assessment - Initial Assessment Questions 1. ONSET: When did the muscle aches or body pains start?      X couple years and worsening last 3 months 2. LOCATION: What part of your body is hurting? (e.g., entire body, arms, legs)      Bilateral arms, bilateral feet,   Pressure in neck and head- 3. SEVERITY: How bad is the pain? (Scale 1-10; or mild, moderate, severe)     8/10 4. CAUSE: What do you think is causing the pains?     unknown 5. FEVER: Do you have a fever? If Yes, ask: What is your temperature, how was it measured, and  when did it start?      no 6. OTHER SYMPTOMS: Do you have any other symptoms? (e.g., chest pain, cold or flu symptoms, rash, weakness, weight loss)     Pressure in neck and head- 7. PREGNANCY: Is there any chance you are pregnant? When was your last menstrual period?     N/a 8. TRAVEL: Have you traveled out of the country in the last month? (e.g., exposures, travel history)     N/a  Answer Assessment - Initial Assessment Questions 1. DESCRIPTION: Describe your dizziness.      Dizzy, overly  tired 2. LIGHTHEADED: Do you feel lightheaded? (e.g., somewhat faint, woozy, weak upon standing)     no 3. VERTIGO: Do you feel like either you or the room is spinning or tilting? (i.e., vertigo)     Spinning  4. SEVERITY: How bad is it?  Do you feel like you are going to faint? Can you stand and walk?     sometimes 5. ONSET:  When did the dizziness begin?     Couple years and worsening last couple of months 6. AGGRAVATING FACTORS: Does anything make it worse? (e.g., standing, change in head position)     no 7. HEART RATE: Can you tell me your heart rate? How many beats in 15 seconds?  (Note: Not all patients can do this.)       no 8. CAUSE: What do you think is causing the dizziness? (e.g., decreased fluids or food, diarrhea, emotional distress, heat exposure, new medicine, sudden standing, vomiting; unknown)     na 9. RECURRENT SYMPTOM: Have you had dizziness before? If Yes, ask: When was the last time? What happened that time?     na 10. OTHER SYMPTOMS: Do you have any other symptoms? (e.g., fever, chest pain, vomiting, diarrhea, bleeding)       Visual changes/sensitive to light 11. PREGNANCY: Is there any chance you are pregnant? When was your last menstrual period?       no  Protocols used: Muscle Aches and Body Pain-A-AH, Dizziness - Lightheadedness-A-AH

## 2024-01-28 ENCOUNTER — Ambulatory Visit: Admitting: Adult Health

## 2024-02-18 ENCOUNTER — Telehealth: Payer: Self-pay

## 2024-02-18 ENCOUNTER — Ambulatory Visit: Payer: Self-pay

## 2024-02-18 ENCOUNTER — Ambulatory Visit (INDEPENDENT_AMBULATORY_CARE_PROVIDER_SITE_OTHER): Admitting: Adult Health

## 2024-02-18 ENCOUNTER — Other Ambulatory Visit: Payer: Self-pay

## 2024-02-18 ENCOUNTER — Ambulatory Visit
Admission: RE | Admit: 2024-02-18 | Discharge: 2024-02-18 | Disposition: A | Discharge status: Home / Self Care | Source: Ambulatory Visit | Attending: Adult Health | Admitting: Adult Health

## 2024-02-18 ENCOUNTER — Encounter: Payer: Self-pay | Admitting: Adult Health

## 2024-02-18 VITALS — BP 135/78 | HR 89 | Temp 97.8°F | Resp 18 | Ht 62.0 in | Wt 190.2 lb

## 2024-02-18 DIAGNOSIS — M5441 Lumbago with sciatica, right side: Secondary | ICD-10-CM

## 2024-02-18 DIAGNOSIS — R7989 Other specified abnormal findings of blood chemistry: Secondary | ICD-10-CM

## 2024-02-18 DIAGNOSIS — G8929 Other chronic pain: Secondary | ICD-10-CM

## 2024-02-18 DIAGNOSIS — M5442 Lumbago with sciatica, left side: Secondary | ICD-10-CM

## 2024-02-18 DIAGNOSIS — R519 Headache, unspecified: Secondary | ICD-10-CM

## 2024-02-18 DIAGNOSIS — Z720 Tobacco use: Secondary | ICD-10-CM | POA: Diagnosis not present

## 2024-02-18 DIAGNOSIS — G629 Polyneuropathy, unspecified: Secondary | ICD-10-CM | POA: Diagnosis not present

## 2024-02-18 DIAGNOSIS — Z7689 Persons encountering health services in other specified circumstances: Secondary | ICD-10-CM

## 2024-02-18 NOTE — Telephone Encounter (Signed)
 Patient was at [facility] and was told that they did not have an order and therefore her DG Lumbar Xray could not be performed. I called [facility] and was told that they do see the order, however they STOP doing xray's at 4 pm.  Call returned to patient and she stated she was there right about 4 pm and she was told again that there was no order. Patient then went back inside of [facility], put me on speaker phone, told the receptionist that I called up there, and was told an order was visible. Receptionist looked again, stated the order looked as though the xray had already been performed,  and they are unable to use the order due to an accession number already in progress ( I had no idea what that meant). I placed the order again and the receptionist then stated it now looked correct and they will be able to perform xray for patient.

## 2024-02-18 NOTE — Patient Instructions (Signed)
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
## 2024-02-19 ENCOUNTER — Ambulatory Visit: Payer: Self-pay | Admitting: Adult Health

## 2024-02-19 DIAGNOSIS — R7989 Other specified abnormal findings of blood chemistry: Secondary | ICD-10-CM

## 2024-02-19 MED ORDER — VITAMIN B-12 1000 MCG PO TABS: 1000.0000 ug | ORAL_TABLET | Freq: Every day | ORAL | 3 refills | Status: AC

## 2024-02-19 NOTE — Progress Notes (Signed)
-     Lumbar imaging showed no acute fracture or malalignment -   Electrolytes and liver enzymes within normal -   TSH 0.37, low, will add total T3 and free T4 for further evaluation -   No anemia -   Cholesterol and LDL elevated, recommending to have at least 150 minutes of moderate exercise/week, increase vegetable intake, will repeat in 3 months to reevaluate -   Vitamin B12 295 (normal [phone]), low normal, start vitamin B12 1000 mcg  PO daily for supplementation

## 2024-02-19 NOTE — Addendum Note (Signed)
 Addended by: TRUDY PABLO HERO on: 02/19/2024 03:03 PM   Modules accepted: Orders

## 2024-02-19 NOTE — Telephone Encounter (Signed)
 Noted

## 2024-02-20 LAB — TEST AUTHORIZATION

## 2024-02-20 LAB — CBC WITH DIFFERENTIAL/PLATELET
Absolute Lymphocytes: 2750 {cells}/uL (ref 850–3900)
Absolute Monocytes: 475 {cells}/uL (ref 200–950)
Basophils Absolute: 43 {cells}/uL (ref 0–200)
Basophils Relative: 0.6 %
Eosinophils Absolute: 259 {cells}/uL (ref 15–500)
Eosinophils Relative: 3.6 %
HCT: 42 % (ref 35.0–45.0)
Hemoglobin: 14.1 g/dL (ref 11.7–15.5)
MCH: 33.1 pg — ABNORMAL HIGH (ref 27.0–33.0)
MCHC: 33.6 g/dL (ref 32.0–36.0)
MCV: 98.6 fL (ref 80.0–100.0)
MPV: 9.2 fL (ref 7.5–12.5)
Monocytes Relative: 6.6 %
Neutro Abs: 3672 {cells}/uL (ref 1500–7800)
Neutrophils Relative %: 51 %
Platelets: 307 Thousand/uL (ref 140–400)
RBC: 4.26 Million/uL (ref 3.80–5.10)
RDW: 11.4 % (ref 11.0–15.0)
Total Lymphocyte: 38.2 %
WBC: 7.2 Thousand/uL (ref 3.8–10.8)

## 2024-02-20 LAB — COMPREHENSIVE METABOLIC PANEL WITH GFR
AG Ratio: 1.8 (calc) (ref 1.0–2.5)
ALT: 14 U/L (ref 6–29)
AST: 15 U/L (ref 10–30)
Albumin: 4.4 g/dL (ref 3.6–5.1)
Alkaline phosphatase (APISO): 68 U/L (ref 31–125)
BUN: 8 mg/dL (ref 7–25)
CO2: 28 mmol/L (ref 20–32)
Calcium: 9.8 mg/dL (ref 8.6–10.2)
Chloride: 103 mmol/L (ref 98–110)
Creat: 0.72 mg/dL (ref 0.50–0.97)
Globulin: 2.5 g/dL (ref 1.9–3.7)
Glucose, Bld: 105 mg/dL — ABNORMAL HIGH (ref 65–99)
Potassium: 4.3 mmol/L (ref 3.5–5.3)
Sodium: 139 mmol/L (ref 135–146)
Total Bilirubin: 0.3 mg/dL (ref 0.2–1.2)
Total Protein: 6.9 g/dL (ref 6.1–8.1)
eGFR: 115 mL/min/1.73m2 (ref >=60)

## 2024-02-20 LAB — LIPID PANEL
Cholesterol: 208 mg/dL — ABNORMAL HIGH (ref <200)
HDL: 49 mg/dL — ABNORMAL LOW (ref >=50)
LDL Cholesterol (Calc): 135 mg/dL — ABNORMAL HIGH
Non-HDL Cholesterol (Calc): 159 mg/dL — ABNORMAL HIGH (ref <130)
Total CHOL/HDL Ratio: 4.2 (calc) (ref <5.0)
Triglycerides: 125 mg/dL (ref <150)

## 2024-02-20 LAB — TSH: TSH: 0.37 m[IU]/L — ABNORMAL LOW

## 2024-02-20 LAB — T3: T3, Total: 167 ng/dL (ref 76–181)

## 2024-02-20 LAB — T4, FREE: Free T4: 1.3 ng/dL (ref 0.8–1.8)

## 2024-02-20 LAB — VITAMIN B12: Vitamin B-12: 295 pg/mL (ref 200–1100)

## 2024-02-29 ENCOUNTER — Ambulatory Visit: Admitting: Adult Health

## 2024-02-29 DIAGNOSIS — E538 Deficiency of other specified B group vitamins: Secondary | ICD-10-CM

## 2024-02-29 DIAGNOSIS — E0590 Thyrotoxicosis, unspecified without thyrotoxic crisis or storm: Secondary | ICD-10-CM

## 2024-03-07 ENCOUNTER — Ambulatory Visit: Admitting: Adult Health

## 2024-03-07 DIAGNOSIS — E538 Deficiency of other specified B group vitamins: Secondary | ICD-10-CM

## 2024-03-07 DIAGNOSIS — E782 Mixed hyperlipidemia: Secondary | ICD-10-CM

## 2024-03-07 DIAGNOSIS — G542 Cervical root disorders, not elsewhere classified: Secondary | ICD-10-CM

## 2024-03-07 DIAGNOSIS — E0590 Thyrotoxicosis, unspecified without thyrotoxic crisis or storm: Secondary | ICD-10-CM

## 2024-03-07 DIAGNOSIS — G8929 Other chronic pain: Secondary | ICD-10-CM

## 2024-03-13 ENCOUNTER — Encounter: Payer: Self-pay | Admitting: Emergency Medicine

## 2024-03-13 ENCOUNTER — Emergency Department
Admission: EM | Admit: 2024-03-13 | Discharge: 2024-03-13 | Disposition: A | Discharge status: Home / Self Care | Attending: Emergency Medicine | Admitting: Emergency Medicine

## 2024-03-13 ENCOUNTER — Other Ambulatory Visit: Payer: Self-pay

## 2024-03-13 DIAGNOSIS — M5412 Radiculopathy, cervical region: Secondary | ICD-10-CM | POA: Insufficient documentation

## 2024-03-13 DIAGNOSIS — M79601 Pain in right arm: Secondary | ICD-10-CM | POA: Diagnosis present

## 2024-03-13 MED ORDER — HYDROCODONE-ACETAMINOPHEN 5-325 MG PO TABS: 1.0000 | ORAL_TABLET | Freq: Four times a day (QID) | ORAL | 0 refills | Status: AC | PRN

## 2024-03-13 MED ORDER — METHOCARBAMOL 500 MG PO TABS: 500.0000 mg | ORAL_TABLET | Freq: Three times a day (TID) | ORAL | 0 refills | Status: AC | PRN

## 2024-03-13 NOTE — Discharge Instructions (Signed)
 Be aware that the pain medicine and muscle relaxer will cause dizziness or drowsiness.  You should not drive or work for about 8 hours after your last dose.  Follow-up with primary care and [facility].

## 2024-03-13 NOTE — ED Triage Notes (Signed)
 Patient to ED via POV for bilateral arm pain. PT reports pain in both arms when sitting or laying. C/o pain in back and head as well. States had MRI/nerve conductive study/x-ray recently and waiting to f/u and just needing something for pain relief.

## 2024-03-13 NOTE — ED Provider Notes (Signed)
   [facility] Provider Note    Event Date/Time   First MD Initiated Contact with Patient 03/13/24 1320     (approximate)   History   Arm Pain   HPI  Jane Bowman is a 33 y.o. female  with no significant past medical history and as listed in EMR presents to the emergency department for treatment and evaluation of bilateral arm pain that keeps her awake at night. She has been evaluated at orthopedics and primary care. She was prescribed prednisone, but read that it may cause death and therefore did not take it. She reports she had an MRI of her cervical spine, but can not see results and no one has contacted her. Primary care got labs and she is aware that her B12 is low, but no one told her what to do about it. She is frustrated due to the pain and can not sleep.     Physical Exam    Vitals:   03/13/24 1211  BP: 118/65  Pulse: 98  Resp: 18  Temp: 98.2 F (36.8 C)  SpO2: 100%    General: Awake, no distress. Tearful. CV:  Good peripheral perfusion.  Resp:  Normal effort.  Abd:  No distention.  Other:  Normal sensation bilateral upper extremities. Grip strength is equal.   ED Results / Procedures / Treatments   Labs (all labs ordered are listed, but only abnormal results are displayed)  Labs Reviewed - No data to display   EKG  Not indicated.  RADIOLOGY  Image and radiology report reviewed and interpreted by me. Radiology report consistent with the same.  Not indicated.  PROCEDURES:  Critical Care performed: No  Procedures   MEDICATIONS ORDERED IN ED:  Medications - No data to display   IMPRESSION / MDM / ASSESSMENT AND PLAN / ED COURSE   I have reviewed the triage note and vital signs. Vital signs stable.   Differential diagnosis includes, but is not limited to, cervical radiculopathy, carpal tunnel syndrome, musculoskeletal pain, anxiety  Patient's presentation is most consistent with acute illness / injury with  system symptoms.   33 year old female presents with ongoing bilateral arm pain. See HPI. Upon chart review, primary care sent a prescription for B12 to her pharmacy on the 8th. Patient reports she was unaware of that and will pick it up with prescriptions from today.   Exam is reassuring. We discussed the potential benefit of taking the prescribed steroid. Additionally, she will be prescribed very short course of pain medication and muscle relaxer. She is to follow up with orthopedics or primary care.     FINAL CLINICAL IMPRESSION(S) / ED DIAGNOSES   Final diagnoses:  Cervical radiculopathy     Rx / DC Orders   ED Discharge Orders          Ordered    methocarbamol  (ROBAXIN ) 500 MG tablet  Every 8 hours PRN        03/13/24 1347    HYDROcodone -acetaminophen  (NORCO/VICODIN) 5-325 MG tablet  Every 6 hours PRN        03/13/24 1347             Note:  This document was prepared using Dragon voice recognition software and may include unintentional dictation errors.   Jane Kirk NOVAK, FNP 03/14/24 CORINN    Dicky Anes, MD 03/15/24 0002
